# Patient Record
Sex: Female | Born: 1950
Health system: Southern US, Community
[De-identification: ages and names within clinical notes are randomized; demographics above are authoritative.]

## PROBLEM LIST (undated history)

## (undated) DIAGNOSIS — I10 Essential (primary) hypertension: Secondary | ICD-10-CM

## (undated) DIAGNOSIS — E871 Hypo-osmolality and hyponatremia: Secondary | ICD-10-CM

## (undated) DIAGNOSIS — F419 Anxiety disorder, unspecified: Secondary | ICD-10-CM

---

## 2000-02-06 ENCOUNTER — Encounter: Payer: Self-pay | Admitting: Internal Medicine

## 2000-02-06 ENCOUNTER — Encounter: Admission: RE | Admit: 2000-02-06 | Discharge: 2000-02-06 | Payer: Self-pay | Admitting: Internal Medicine

## 2000-02-13 ENCOUNTER — Other Ambulatory Visit: Admission: RE | Admit: 2000-02-13 | Discharge: 2000-02-13 | Payer: Self-pay | Admitting: Internal Medicine

## 2003-04-05 ENCOUNTER — Ambulatory Visit (HOSPITAL_COMMUNITY): Admission: RE | Admit: 2003-04-05 | Discharge: 2003-04-05 | Payer: Self-pay | Admitting: Gastroenterology

## 2003-04-05 ENCOUNTER — Encounter (INDEPENDENT_AMBULATORY_CARE_PROVIDER_SITE_OTHER): Payer: Self-pay | Admitting: Specialist

## 2004-06-16 ENCOUNTER — Other Ambulatory Visit: Admission: RE | Admit: 2004-06-16 | Discharge: 2004-06-16 | Payer: Self-pay | Admitting: Family Medicine

## 2005-06-18 ENCOUNTER — Other Ambulatory Visit: Admission: RE | Admit: 2005-06-18 | Discharge: 2005-06-18 | Payer: Self-pay | Admitting: Family Medicine

## 2006-07-08 ENCOUNTER — Other Ambulatory Visit: Admission: RE | Admit: 2006-07-08 | Discharge: 2006-07-08 | Payer: Self-pay | Admitting: Family Medicine

## 2007-09-22 ENCOUNTER — Other Ambulatory Visit: Admission: RE | Admit: 2007-09-22 | Discharge: 2007-09-22 | Payer: Self-pay | Admitting: Family Medicine

## 2009-09-28 ENCOUNTER — Other Ambulatory Visit: Admission: RE | Admit: 2009-09-28 | Discharge: 2009-09-28 | Payer: Self-pay | Admitting: Family Medicine

## 2010-10-04 ENCOUNTER — Other Ambulatory Visit: Payer: Self-pay | Admitting: Physician Assistant

## 2010-10-04 ENCOUNTER — Other Ambulatory Visit (HOSPITAL_COMMUNITY)
Admission: RE | Admit: 2010-10-04 | Discharge: 2010-10-04 | Disposition: A | Discharge status: Home / Self Care | Payer: BC Managed Care – PPO | Source: Ambulatory Visit | Attending: Family Medicine | Admitting: Family Medicine

## 2010-10-04 DIAGNOSIS — Z01419 Encounter for gynecological examination (general) (routine) without abnormal findings: Secondary | ICD-10-CM | POA: Insufficient documentation

## 2010-10-05 ENCOUNTER — Other Ambulatory Visit: Payer: Self-pay | Admitting: Dermatology

## 2010-12-26 ENCOUNTER — Other Ambulatory Visit: Payer: Self-pay | Admitting: Dermatology

## 2011-01-19 NOTE — Op Note (Signed)
   NAME:  Jenna [name], Jenna [name]                        ACCOUNT NO.:  [id number]   MEDICAL RECORD NO.:  [id number]                   PATIENT TYPE:  AMB   LOCATION:  ENDO                                 FACILITY:  [facility]   PHYSICIAN:  Graylin Shiver, M.D.                DATE OF BIRTH:  [birth year]   DATE OF PROCEDURE:  04/05/2003  DATE OF DISCHARGE:                                 OPERATIVE REPORT   PROCEDURE:  Colonoscopy with biopsy.   INDICATION FOR PROCEDURE:  Family history of colon polyps.   PREMEDICATION:  Fentanyl 62.5 mcg IV, Versed 7 mg IV.   Informed consent was obtained after explanation of the risks of bleeding,  infection, and perforation.   DESCRIPTION OF PROCEDURE:  With the patient in the left lateral decubitus  position, a rectal exam was performed and no masses were felt.  The Olympus  colonoscope was inserted into the rectum and advanced around the colon to  the cecum, the cecal landmarks identified.  The cecum and the ascending  colon were normal.  At the level of the hepatic flexure there was a small 3  mm sessile polyp removed with cold forceps.  The transverse colon looked  normal.  The descending colon, sigmoid, and rectum looked normal.  She  tolerated the procedure well without complications.   IMPRESSION:  Small polyp at the hepatic flexure.   PLAN:  The pathology will be checked.                                               Graylin Shiver, M.D.    Germain Osgood  D:  04/05/2003  T:  04/05/2003  Job:  [id number]   cc:   Ma Hillock, PA, Deboraha Sprang at [facility]

## 2011-10-18 ENCOUNTER — Other Ambulatory Visit: Payer: Self-pay | Admitting: Physician Assistant

## 2011-10-18 ENCOUNTER — Other Ambulatory Visit (HOSPITAL_COMMUNITY)
Admission: RE | Admit: 2011-10-18 | Discharge: 2011-10-18 | Disposition: A | Discharge status: Home / Self Care | Payer: BC Managed Care – PPO | Source: Ambulatory Visit | Attending: Family Medicine | Admitting: Family Medicine

## 2011-10-18 DIAGNOSIS — Z01419 Encounter for gynecological examination (general) (routine) without abnormal findings: Secondary | ICD-10-CM | POA: Insufficient documentation

## 2012-02-22 ENCOUNTER — Other Ambulatory Visit: Payer: Self-pay | Admitting: Dermatology

## 2012-10-24 ENCOUNTER — Other Ambulatory Visit (HOSPITAL_COMMUNITY)
Admission: RE | Admit: 2012-10-24 | Discharge: 2012-10-24 | Disposition: A | Discharge status: Home / Self Care | Payer: BC Managed Care – PPO | Source: Ambulatory Visit | Attending: Family Medicine | Admitting: Family Medicine

## 2012-10-24 ENCOUNTER — Other Ambulatory Visit: Payer: Self-pay | Admitting: Physician Assistant

## 2012-10-24 DIAGNOSIS — Z124 Encounter for screening for malignant neoplasm of cervix: Secondary | ICD-10-CM | POA: Insufficient documentation

## 2014-04-08 ENCOUNTER — Other Ambulatory Visit: Payer: Self-pay | Admitting: Dermatology

## 2014-10-05 ENCOUNTER — Other Ambulatory Visit: Payer: Self-pay | Admitting: Dermatology

## 2014-11-18 ENCOUNTER — Other Ambulatory Visit (HOSPITAL_COMMUNITY)
Admission: RE | Admit: 2014-11-18 | Discharge: 2014-11-18 | Disposition: A | Discharge status: Home / Self Care | Payer: 59 | Source: Ambulatory Visit | Attending: Family Medicine | Admitting: Family Medicine

## 2014-11-18 ENCOUNTER — Other Ambulatory Visit: Payer: Self-pay | Admitting: Physician Assistant

## 2014-11-18 DIAGNOSIS — Z124 Encounter for screening for malignant neoplasm of cervix: Secondary | ICD-10-CM | POA: Insufficient documentation

## 2014-11-19 LAB — CYTOLOGY - PAP

## 2015-11-25 ENCOUNTER — Ambulatory Visit
Admission: RE | Admit: 2015-11-25 | Discharge: 2015-11-25 | Disposition: A | Discharge status: Home / Self Care | Payer: BLUE CROSS/BLUE SHIELD | Source: Ambulatory Visit | Attending: Physician Assistant | Admitting: Physician Assistant

## 2015-11-25 ENCOUNTER — Other Ambulatory Visit: Payer: Self-pay | Admitting: Physician Assistant

## 2015-11-25 DIAGNOSIS — R52 Pain, unspecified: Secondary | ICD-10-CM

## 2016-03-23 ENCOUNTER — Encounter: Payer: Self-pay | Admitting: Podiatry

## 2016-03-23 ENCOUNTER — Ambulatory Visit (INDEPENDENT_AMBULATORY_CARE_PROVIDER_SITE_OTHER): Payer: BLUE CROSS/BLUE SHIELD | Admitting: Podiatry

## 2016-03-23 ENCOUNTER — Ambulatory Visit (INDEPENDENT_AMBULATORY_CARE_PROVIDER_SITE_OTHER): Payer: BLUE CROSS/BLUE SHIELD

## 2016-03-23 VITALS — BP 166/101 | HR 52 | Resp 14

## 2016-03-23 DIAGNOSIS — M79672 Pain in left foot: Secondary | ICD-10-CM | POA: Diagnosis not present

## 2016-03-23 DIAGNOSIS — M21619 Bunion of unspecified foot: Secondary | ICD-10-CM | POA: Diagnosis not present

## 2016-03-23 NOTE — Patient Instructions (Signed)
Bunionectomy A bunionectomy is a surgical procedure to remove a bunion. A bunion is a visible bump of bone on the inside of your foot where your big toe meets the rest of your foot. A bunion can develop when pressure turns this bone (first metatarsal) toward the other toes. Shoes that are too tight are the most common cause of bunions. Bunions can also be caused by diseases, such as arthritis and polio. You may need a bunionectomy if your bunion is very large and painful or it affects your ability to walk. LET [facility] CARE PROVIDER KNOW ABOUT:  Any allergies you have.  All medicines you are taking, including vitamins, herbs, eye drops, creams, and over-the-counter medicines.  Previous problems you or members of your family have had with the use of anesthetics.  Any blood disorders you have.  Previous surgeries you have had.  Medical conditions you have. RISKS AND COMPLICATIONS  Generally, this is a safe procedure. However, problems may occur, including:  Infection.  Pain.  Nerve damage.  Bleeding or blood clots.  Reactions to medicines.  Numbness, stiffness, or arthritis in your toe.  Foot problems that continue even after the procedure. BEFORE THE PROCEDURE  Ask your health care provider about:  Changing or stopping your regular medicines. This is especially important if you are taking diabetes medicines or blood thinners.  Taking medicines such as aspirin and ibuprofen. These medicines can thin your blood. Do not take these medicines before your procedure if your health care provider instructs you not to.  Do not drink alcohol before the procedure as directed by your health care provider.  Do not use tobacco products, including cigarettes, chewing tobacco, or electronic cigarettes, before the procedure as directed by your health care provider. If you need help quitting, ask your health care provider.  Ask your health care provider what kind of medicine you will be  given during your procedure. A bunionectomy may be done using one of these:  A medicine that numbs the area (local anesthetic).  A medicine that makes you go to sleep (general anesthetic). If you will be given general anesthetic, do not eat or drink anything after midnight on the night before the procedure or as directed by your health care provider. PROCEDURE  An IV tube may be inserted into a vein.  You will be given local anesthetic or general anesthetic.  The surgeon will make a cut (incision) over the enlarged area at the first joint of the big toe. The surgeon will remove the bunion.  You may have more than one incision if any of the bones in your big toe need to be moved. A bone itself may need to be cut.  Sometimes the tissues around the big toe may also need to be cut then tightened or loosened to reposition the toe.  Screws or other hardware may be used to keep your foot in thecorrect position.  The incision will be closed with stitches (sutures) and covered with adhesive strips or another type of bandage (dressing). AFTER THE PROCEDURE  You may spend some time in a recovery area.  Your blood pressure, heart rate, breathing rate, and blood oxygen level will be monitored often until the medicines you were given have worn off.   This information is not intended to replace advice given to you by your health care provider. Make sure you discuss any questions you have with your health care provider.   Document Released: 08/03/2005 Document Revised: 05/11/2015 Document Reviewed: 04/07/2014  Chartered certified accountant Patient Education [employer].

## 2016-03-23 NOTE — Progress Notes (Signed)
   Subjective:    Patient ID: Jenna [name], female    DOB: 1950/09/07, 65 y.o.   MRN: [id number]  HPI  My left great to started hurting more persistently in the last few months     Review of Systems  All other systems reviewed and are negative.      Objective:   Physical Exam        Assessment & Plan:

## 2016-03-28 NOTE — Progress Notes (Signed)
Subjective:     Patient ID: Jenna [name], female   DOB: [birth year], 65 y.o.   MRN: [id number]  HPI patient presents stating that she's had a painful bunion has gotten gradually worse over the last year on the left over right foot and makes it hard to wear shoe gear comfortably. She's had history of injury and history of this problem in the family   Review of Systems  All other systems reviewed and are negative.      Objective:   Physical Exam  Constitutional: She is oriented to person, place, and time.  Cardiovascular: Intact distal pulses.   Musculoskeletal: Normal range of motion.  Neurological: She is oriented to person, place, and time.  Skin: Skin is warm.  Nursing note and vitals reviewed.  neurovascular status intact muscle strength adequate range of motion within normal limits with patient found to have hyperostosis medial aspect first metatarsal left over right with redness and pain around the joint surface and deviation the hallux against the second toe. 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 I do think long-term that this would be best to go ahead and fix and I discussed surgery and reviewed with patient would be required. Patient wants surgery but needs to wait till September due to going on vacation the first week of September. She will reappoint for consult and we will get this fixed for  X-ray report was negative for signs of stress fracture or arthritis and did indicate elevation of the intermetatarsal angle between the first and second metatarsal bilateral

## 2016-05-11 ENCOUNTER — Ambulatory Visit (INDEPENDENT_AMBULATORY_CARE_PROVIDER_SITE_OTHER): Payer: BLUE CROSS/BLUE SHIELD | Admitting: Podiatry

## 2016-05-11 ENCOUNTER — Encounter: Payer: Self-pay | Admitting: Podiatry

## 2016-05-11 DIAGNOSIS — M21619 Bunion of unspecified foot: Secondary | ICD-10-CM | POA: Diagnosis not present

## 2016-05-11 DIAGNOSIS — M205X2 Other deformities of toe(s) (acquired), left foot: Secondary | ICD-10-CM

## 2016-05-11 NOTE — Patient Instructions (Signed)

## 2016-05-13 NOTE — Progress Notes (Signed)
Subjective:     Patient ID: Jenna [name], female   DOB: 03-06-1951, 65 y.o.   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 Patient wants surgery and after extensive review signs consent form for correction knowing all possible complications as listed and patient is dispensed air fracture walker with instructions and encouraged to call with any questions. Understands recovery can take 6 months to one year

## 2016-05-22 ENCOUNTER — Encounter: Payer: Self-pay | Admitting: Podiatry

## 2016-05-22 DIAGNOSIS — M2012 Hallux valgus (acquired), left foot: Secondary | ICD-10-CM | POA: Diagnosis not present

## 2016-05-25 NOTE — Progress Notes (Signed)
DOS 09.19.2017 Liane Comber Bunionectomy (cutting and moving bone) with Pin Fixation Left Foot

## 2016-05-31 ENCOUNTER — Encounter: Payer: Self-pay | Admitting: Podiatry

## 2016-05-31 ENCOUNTER — Ambulatory Visit (INDEPENDENT_AMBULATORY_CARE_PROVIDER_SITE_OTHER): Payer: BLUE CROSS/BLUE SHIELD

## 2016-05-31 ENCOUNTER — Ambulatory Visit (INDEPENDENT_AMBULATORY_CARE_PROVIDER_SITE_OTHER): Payer: BLUE CROSS/BLUE SHIELD | Admitting: Podiatry

## 2016-05-31 VITALS — BP 160/82

## 2016-05-31 DIAGNOSIS — M21619 Bunion of unspecified foot: Secondary | ICD-10-CM

## 2016-05-31 DIAGNOSIS — M205X2 Other deformities of toe(s) (acquired), left foot: Secondary | ICD-10-CM | POA: Diagnosis not present

## 2016-05-31 NOTE — Progress Notes (Signed)
Subjective:     Patient ID: Jenna [name], female   DOB: [birth year], 65 y.o.   MRN: [id number]  HPI patient states I'm doing well with minimal discomfort   Review of Systems     Objective:   Physical Exam Neurovascular status intact negative Homans sign noted with patient's first MPJ left doing well with good range of motion and no crepitus noted with wound edges well coapted    Assessment:     Doing well post osteotomy first metatarsal left with wound edges coapted well good alignment and good range of motion    Plan:     X-ray reviewed and applied above ankle Bio[employer] brace to plantarflexed the hallux to provide stretching type support. Patient had sterile dressing reapplied continue immobilization elevation and reappoint to recheck  X-ray report indicated osteotomies healing well pins in place with joint congruence

## 2016-06-21 ENCOUNTER — Ambulatory Visit (INDEPENDENT_AMBULATORY_CARE_PROVIDER_SITE_OTHER): Payer: BLUE CROSS/BLUE SHIELD | Admitting: Podiatry

## 2016-06-21 ENCOUNTER — Ambulatory Visit (INDEPENDENT_AMBULATORY_CARE_PROVIDER_SITE_OTHER): Payer: BLUE CROSS/BLUE SHIELD

## 2016-06-21 ENCOUNTER — Other Ambulatory Visit: Payer: BLUE CROSS/BLUE SHIELD

## 2016-06-21 DIAGNOSIS — M21619 Bunion of unspecified foot: Secondary | ICD-10-CM | POA: Diagnosis not present

## 2016-06-21 DIAGNOSIS — M205X2 Other deformities of toe(s) (acquired), left foot: Secondary | ICD-10-CM

## 2016-06-27 NOTE — Progress Notes (Signed)
Subjective:     Patient ID: Jenna [name], female   DOB: May 22, 1951, 65 y.o.   MRN: [id number]  HPI patient states she's doing well with her foot   Review of Systems     Objective:   Physical Exam Neurovascular status intact negative Homans sign noted with well healed surgical site first metatarsal with good alignment and good range of motion    Assessment:     Doing well post [place] osteotomy    Plan:     Reviewed x-rays and advised on continued anti-inflammatories immobilization and gradually increase in shoe gear. Patient will be seen back to recheck  X-ray report indicated that there is good alignment the pins are in place and the joint is congruence

## 2016-07-19 ENCOUNTER — Ambulatory Visit (INDEPENDENT_AMBULATORY_CARE_PROVIDER_SITE_OTHER): Payer: BLUE CROSS/BLUE SHIELD

## 2016-07-19 ENCOUNTER — Encounter: Payer: Self-pay | Admitting: Podiatry

## 2016-07-19 ENCOUNTER — Ambulatory Visit (INDEPENDENT_AMBULATORY_CARE_PROVIDER_SITE_OTHER): Payer: BLUE CROSS/BLUE SHIELD | Admitting: Podiatry

## 2016-07-19 DIAGNOSIS — M21619 Bunion of unspecified foot: Secondary | ICD-10-CM | POA: Diagnosis not present

## 2016-07-19 DIAGNOSIS — Z9889 Other specified postprocedural states: Secondary | ICD-10-CM | POA: Diagnosis not present

## 2016-07-19 DIAGNOSIS — M205X2 Other deformities of toe(s) (acquired), left foot: Secondary | ICD-10-CM | POA: Diagnosis not present

## 2016-07-20 NOTE — Progress Notes (Signed)
Subjective:     Patient ID: Jenna [name], female   DOB: 1951-05-20, 65 y.o.   MRN: [id number]  HPI patient presents stating I'm doing very well with my foot with minimal discomfort   Review of Systems     Objective:   Physical Exam Neurovascular status intact no health history changes with good alignment noted range of motion and wound edges that are well coapted with no opening drainage or redness    Assessment:     Doing well post osteotomy left    Plan:     Advised on return to normal activities continuation range of motion exercises and reappoint as needed if any issues should occur  X-ray report indicated pins are in place with no signs of movement or other pathology

## 2017-03-21 IMAGING — CR DG HIP (WITH OR WITHOUT PELVIS) 2-3V*R*
2 series · 2 of 2 positions shown · non-contrast
Comparison: None.

CLINICAL DATA: Right hip pain for 2 months, no known injury

EXAM:
DG HIP (WITH OR WITHOUT PELVIS) 2-3V RIGHT

[view not recorded (1 of 2)]
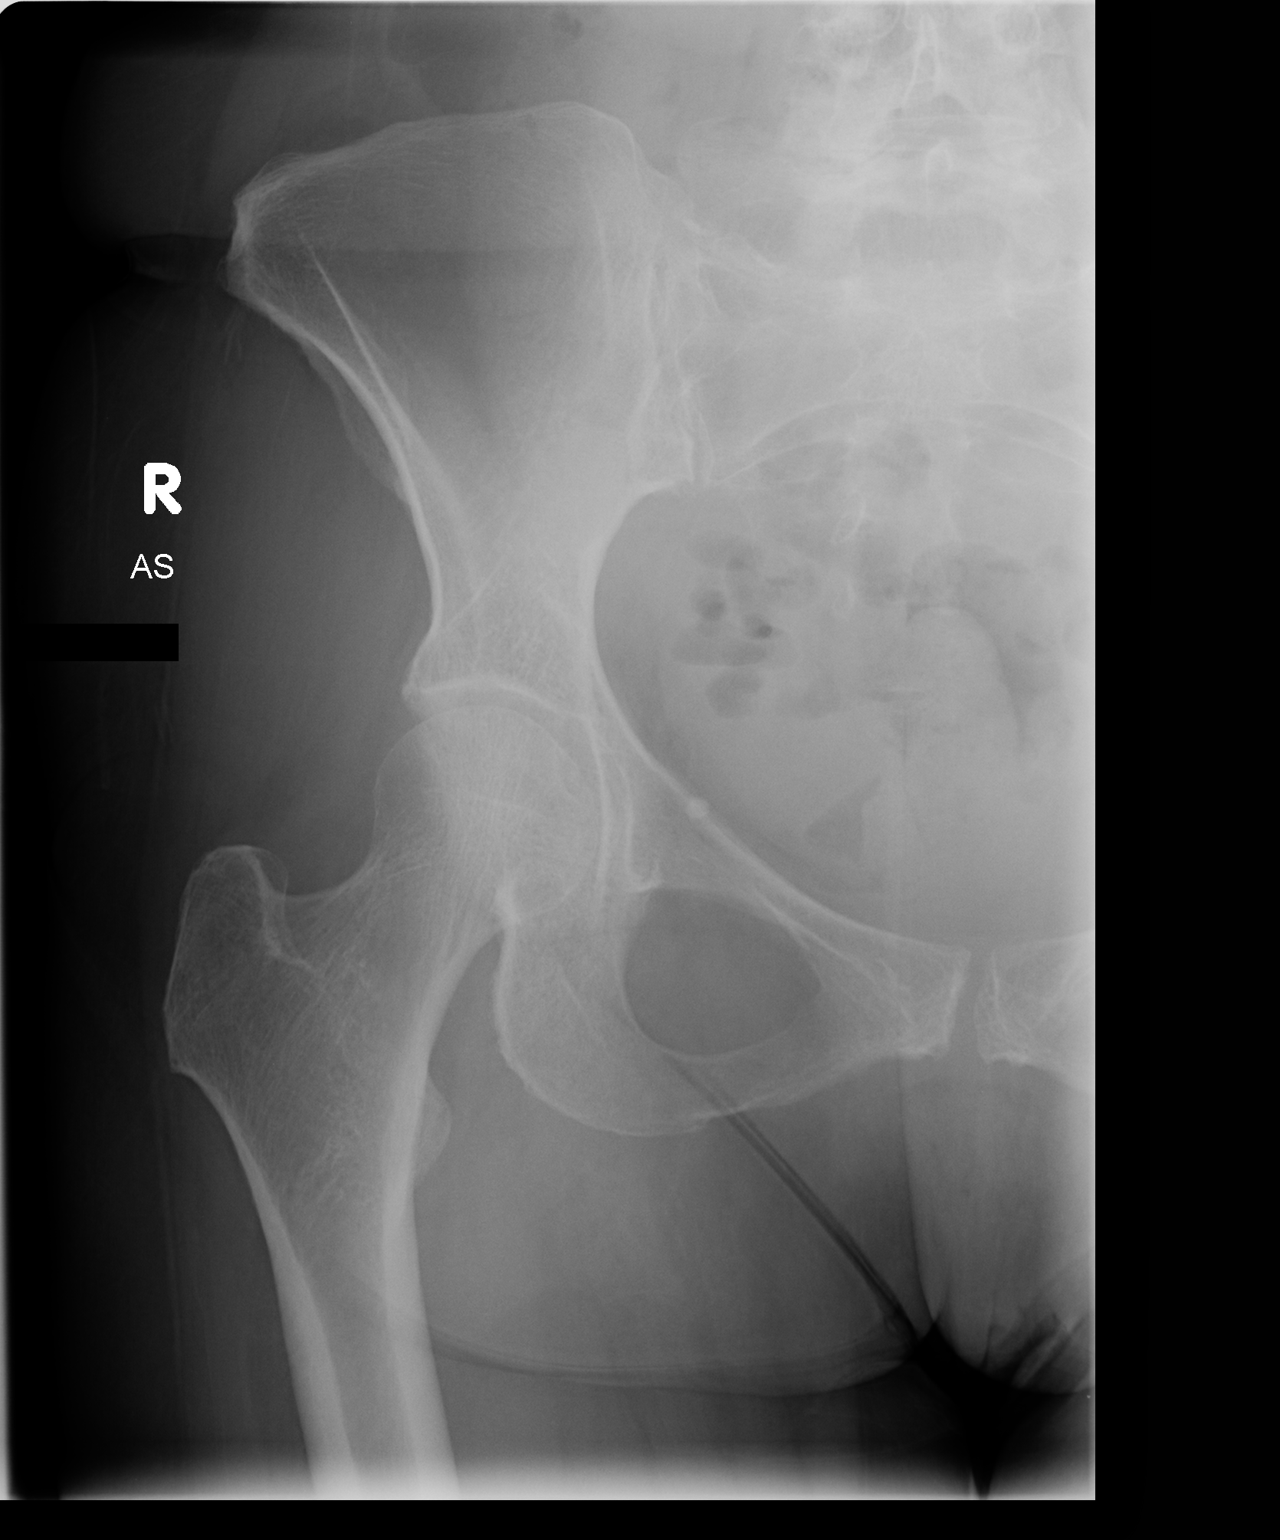

[view not recorded (2 of 2)]
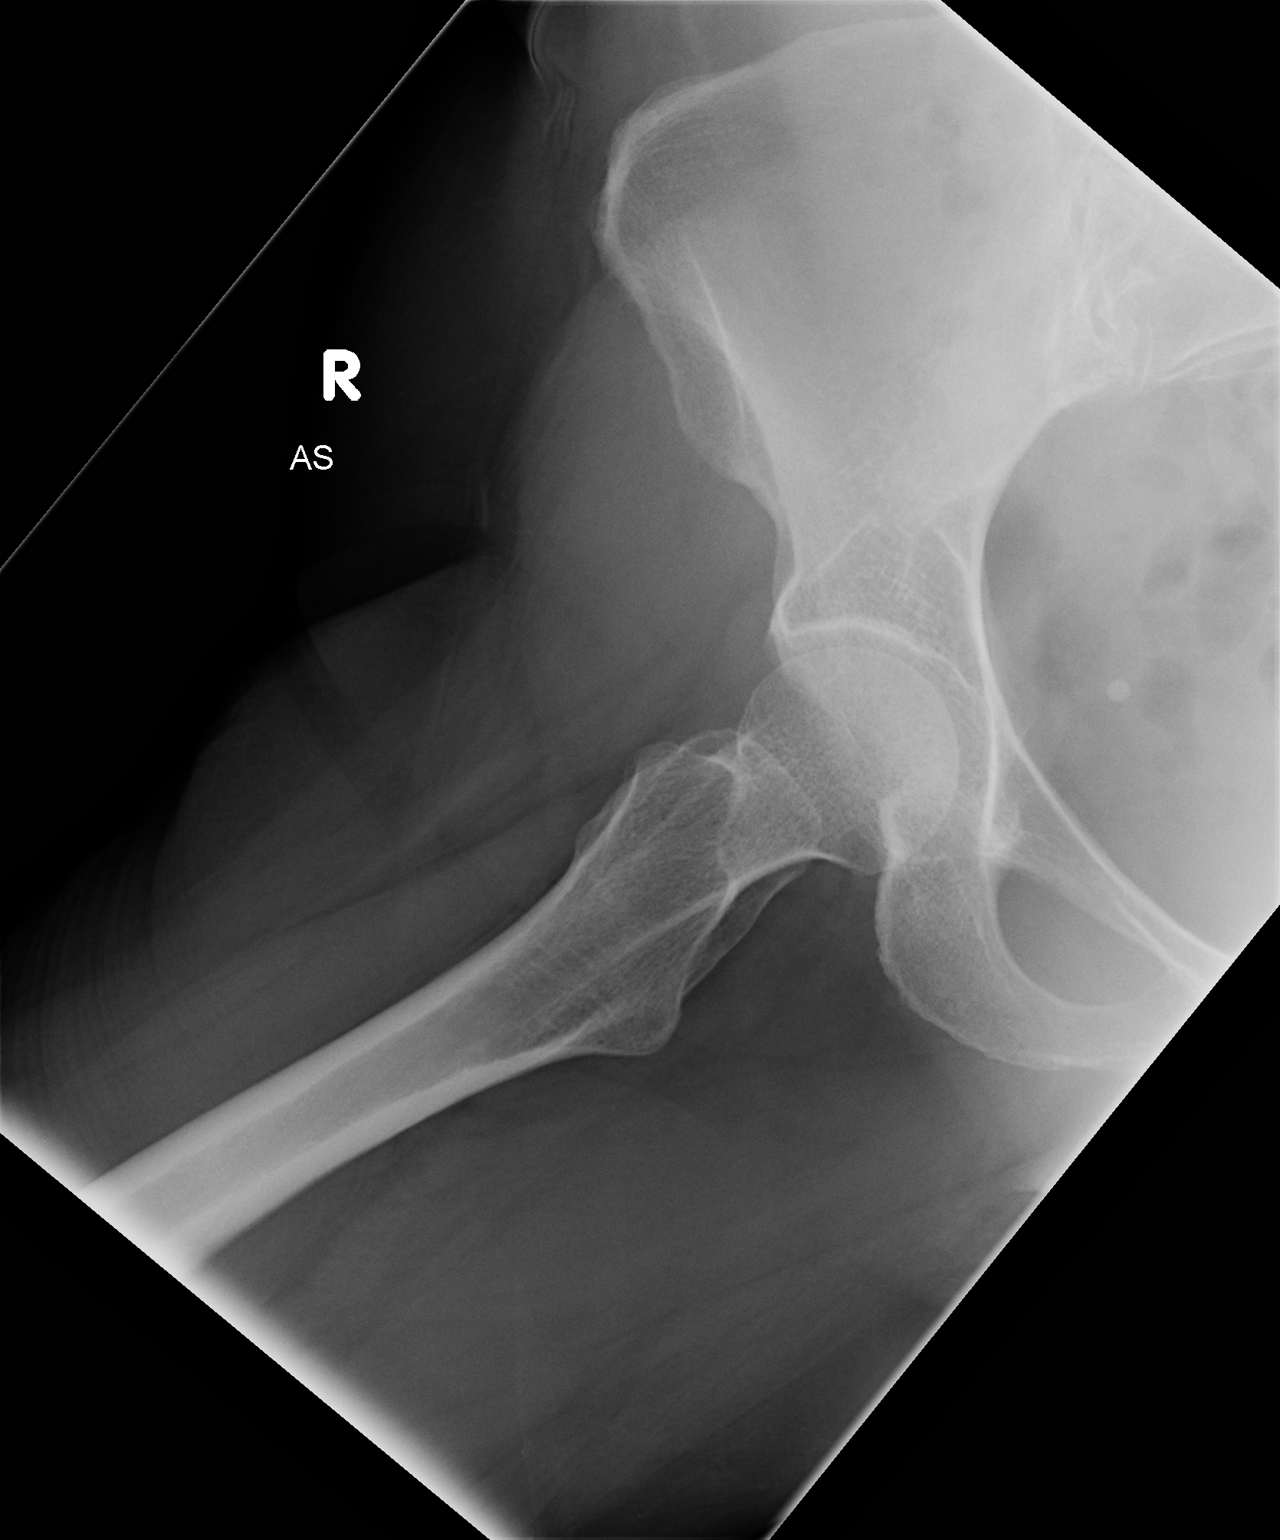

[2 of 2 positions shown; findings below may reference images not displayed]

FINDINGS: Two views of the right hip submitted. No acute fracture or
subluxation. No radiopaque foreign body. Right pelvic phlebolith is
noted. Joint space is preserved.
IMPRESSION: Negative.

## 2017-09-16 DIAGNOSIS — H2513 Age-related nuclear cataract, bilateral: Secondary | ICD-10-CM | POA: Diagnosis not present

## 2017-09-20 DIAGNOSIS — M9903 Segmental and somatic dysfunction of lumbar region: Secondary | ICD-10-CM | POA: Diagnosis not present

## 2017-09-20 DIAGNOSIS — M546 Pain in thoracic spine: Secondary | ICD-10-CM | POA: Diagnosis not present

## 2017-09-20 DIAGNOSIS — M5417 Radiculopathy, lumbosacral region: Secondary | ICD-10-CM | POA: Diagnosis not present

## 2017-09-20 DIAGNOSIS — M9902 Segmental and somatic dysfunction of thoracic region: Secondary | ICD-10-CM | POA: Diagnosis not present

## 2017-09-20 DIAGNOSIS — M62830 Muscle spasm of back: Secondary | ICD-10-CM | POA: Diagnosis not present

## 2017-09-26 DIAGNOSIS — M62830 Muscle spasm of back: Secondary | ICD-10-CM | POA: Diagnosis not present

## 2017-09-26 DIAGNOSIS — M9903 Segmental and somatic dysfunction of lumbar region: Secondary | ICD-10-CM | POA: Diagnosis not present

## 2017-09-26 DIAGNOSIS — M546 Pain in thoracic spine: Secondary | ICD-10-CM | POA: Diagnosis not present

## 2017-09-26 DIAGNOSIS — M5417 Radiculopathy, lumbosacral region: Secondary | ICD-10-CM | POA: Diagnosis not present

## 2017-09-26 DIAGNOSIS — M9902 Segmental and somatic dysfunction of thoracic region: Secondary | ICD-10-CM | POA: Diagnosis not present

## 2017-10-02 DIAGNOSIS — M62830 Muscle spasm of back: Secondary | ICD-10-CM | POA: Diagnosis not present

## 2017-10-02 DIAGNOSIS — M5417 Radiculopathy, lumbosacral region: Secondary | ICD-10-CM | POA: Diagnosis not present

## 2017-10-02 DIAGNOSIS — M9902 Segmental and somatic dysfunction of thoracic region: Secondary | ICD-10-CM | POA: Diagnosis not present

## 2017-10-02 DIAGNOSIS — M546 Pain in thoracic spine: Secondary | ICD-10-CM | POA: Diagnosis not present

## 2017-10-02 DIAGNOSIS — M9903 Segmental and somatic dysfunction of lumbar region: Secondary | ICD-10-CM | POA: Diagnosis not present

## 2017-10-04 DIAGNOSIS — M5417 Radiculopathy, lumbosacral region: Secondary | ICD-10-CM | POA: Diagnosis not present

## 2017-10-04 DIAGNOSIS — M546 Pain in thoracic spine: Secondary | ICD-10-CM | POA: Diagnosis not present

## 2017-10-04 DIAGNOSIS — M62830 Muscle spasm of back: Secondary | ICD-10-CM | POA: Diagnosis not present

## 2017-10-04 DIAGNOSIS — M9903 Segmental and somatic dysfunction of lumbar region: Secondary | ICD-10-CM | POA: Diagnosis not present

## 2017-10-04 DIAGNOSIS — M9902 Segmental and somatic dysfunction of thoracic region: Secondary | ICD-10-CM | POA: Diagnosis not present

## 2017-10-09 DIAGNOSIS — M9903 Segmental and somatic dysfunction of lumbar region: Secondary | ICD-10-CM | POA: Diagnosis not present

## 2017-10-09 DIAGNOSIS — M546 Pain in thoracic spine: Secondary | ICD-10-CM | POA: Diagnosis not present

## 2017-10-09 DIAGNOSIS — M9902 Segmental and somatic dysfunction of thoracic region: Secondary | ICD-10-CM | POA: Diagnosis not present

## 2017-10-09 DIAGNOSIS — M5417 Radiculopathy, lumbosacral region: Secondary | ICD-10-CM | POA: Diagnosis not present

## 2017-10-09 DIAGNOSIS — M62830 Muscle spasm of back: Secondary | ICD-10-CM | POA: Diagnosis not present

## 2017-10-11 DIAGNOSIS — M62830 Muscle spasm of back: Secondary | ICD-10-CM | POA: Diagnosis not present

## 2017-10-11 DIAGNOSIS — M9903 Segmental and somatic dysfunction of lumbar region: Secondary | ICD-10-CM | POA: Diagnosis not present

## 2017-10-11 DIAGNOSIS — M5417 Radiculopathy, lumbosacral region: Secondary | ICD-10-CM | POA: Diagnosis not present

## 2017-10-11 DIAGNOSIS — M546 Pain in thoracic spine: Secondary | ICD-10-CM | POA: Diagnosis not present

## 2017-10-11 DIAGNOSIS — M9902 Segmental and somatic dysfunction of thoracic region: Secondary | ICD-10-CM | POA: Diagnosis not present

## 2017-10-18 DIAGNOSIS — E7800 Pure hypercholesterolemia, unspecified: Secondary | ICD-10-CM | POA: Diagnosis not present

## 2017-10-24 DIAGNOSIS — M62830 Muscle spasm of back: Secondary | ICD-10-CM | POA: Diagnosis not present

## 2017-10-24 DIAGNOSIS — M9902 Segmental and somatic dysfunction of thoracic region: Secondary | ICD-10-CM | POA: Diagnosis not present

## 2017-10-24 DIAGNOSIS — M9903 Segmental and somatic dysfunction of lumbar region: Secondary | ICD-10-CM | POA: Diagnosis not present

## 2017-10-24 DIAGNOSIS — M5417 Radiculopathy, lumbosacral region: Secondary | ICD-10-CM | POA: Diagnosis not present

## 2017-10-24 DIAGNOSIS — M546 Pain in thoracic spine: Secondary | ICD-10-CM | POA: Diagnosis not present

## 2017-11-01 DIAGNOSIS — L821 Other seborrheic keratosis: Secondary | ICD-10-CM | POA: Diagnosis not present

## 2017-11-01 DIAGNOSIS — Z808 Family history of malignant neoplasm of other organs or systems: Secondary | ICD-10-CM | POA: Diagnosis not present

## 2017-11-01 DIAGNOSIS — Z23 Encounter for immunization: Secondary | ICD-10-CM | POA: Diagnosis not present

## 2017-11-01 DIAGNOSIS — L570 Actinic keratosis: Secondary | ICD-10-CM | POA: Diagnosis not present

## 2017-11-01 DIAGNOSIS — Z85828 Personal history of other malignant neoplasm of skin: Secondary | ICD-10-CM | POA: Diagnosis not present

## 2017-11-01 DIAGNOSIS — L814 Other melanin hyperpigmentation: Secondary | ICD-10-CM | POA: Diagnosis not present

## 2017-11-07 DIAGNOSIS — R69 Illness, unspecified: Secondary | ICD-10-CM | POA: Diagnosis not present

## 2017-11-08 DIAGNOSIS — M9903 Segmental and somatic dysfunction of lumbar region: Secondary | ICD-10-CM | POA: Diagnosis not present

## 2017-11-08 DIAGNOSIS — M9902 Segmental and somatic dysfunction of thoracic region: Secondary | ICD-10-CM | POA: Diagnosis not present

## 2017-11-08 DIAGNOSIS — M546 Pain in thoracic spine: Secondary | ICD-10-CM | POA: Diagnosis not present

## 2017-11-08 DIAGNOSIS — M62830 Muscle spasm of back: Secondary | ICD-10-CM | POA: Diagnosis not present

## 2017-11-08 DIAGNOSIS — M5417 Radiculopathy, lumbosacral region: Secondary | ICD-10-CM | POA: Diagnosis not present

## 2017-11-22 DIAGNOSIS — M5417 Radiculopathy, lumbosacral region: Secondary | ICD-10-CM | POA: Diagnosis not present

## 2017-11-22 DIAGNOSIS — M9903 Segmental and somatic dysfunction of lumbar region: Secondary | ICD-10-CM | POA: Diagnosis not present

## 2017-11-22 DIAGNOSIS — M62830 Muscle spasm of back: Secondary | ICD-10-CM | POA: Diagnosis not present

## 2017-11-22 DIAGNOSIS — M9902 Segmental and somatic dysfunction of thoracic region: Secondary | ICD-10-CM | POA: Diagnosis not present

## 2017-11-22 DIAGNOSIS — M546 Pain in thoracic spine: Secondary | ICD-10-CM | POA: Diagnosis not present

## 2017-12-25 DIAGNOSIS — M62830 Muscle spasm of back: Secondary | ICD-10-CM | POA: Diagnosis not present

## 2017-12-25 DIAGNOSIS — M5417 Radiculopathy, lumbosacral region: Secondary | ICD-10-CM | POA: Diagnosis not present

## 2017-12-25 DIAGNOSIS — M9902 Segmental and somatic dysfunction of thoracic region: Secondary | ICD-10-CM | POA: Diagnosis not present

## 2017-12-25 DIAGNOSIS — M9903 Segmental and somatic dysfunction of lumbar region: Secondary | ICD-10-CM | POA: Diagnosis not present

## 2017-12-25 DIAGNOSIS — M546 Pain in thoracic spine: Secondary | ICD-10-CM | POA: Diagnosis not present

## 2018-01-02 ENCOUNTER — Other Ambulatory Visit (HOSPITAL_COMMUNITY)
Admission: RE | Admit: 2018-01-02 | Discharge: 2018-01-02 | Disposition: A | Discharge status: Home / Self Care | Payer: Medicare HMO | Source: Ambulatory Visit | Attending: Family Medicine | Admitting: Family Medicine

## 2018-01-02 ENCOUNTER — Other Ambulatory Visit: Payer: Self-pay | Admitting: Physician Assistant

## 2018-01-02 DIAGNOSIS — E7800 Pure hypercholesterolemia, unspecified: Secondary | ICD-10-CM | POA: Diagnosis not present

## 2018-01-02 DIAGNOSIS — Z0000 Encounter for general adult medical examination without abnormal findings: Secondary | ICD-10-CM | POA: Diagnosis not present

## 2018-01-02 DIAGNOSIS — Z124 Encounter for screening for malignant neoplasm of cervix: Secondary | ICD-10-CM | POA: Diagnosis not present

## 2018-01-02 DIAGNOSIS — Z23 Encounter for immunization: Secondary | ICD-10-CM | POA: Diagnosis not present

## 2018-01-02 DIAGNOSIS — M810 Age-related osteoporosis without current pathological fracture: Secondary | ICD-10-CM | POA: Diagnosis not present

## 2018-01-02 DIAGNOSIS — Z01419 Encounter for gynecological examination (general) (routine) without abnormal findings: Secondary | ICD-10-CM | POA: Insufficient documentation

## 2018-01-02 DIAGNOSIS — I10 Essential (primary) hypertension: Secondary | ICD-10-CM | POA: Diagnosis not present

## 2018-01-02 DIAGNOSIS — Z1159 Encounter for screening for other viral diseases: Secondary | ICD-10-CM | POA: Diagnosis not present

## 2018-01-02 DIAGNOSIS — R69 Illness, unspecified: Secondary | ICD-10-CM | POA: Diagnosis not present

## 2018-01-03 LAB — CYTOLOGY - PAP: Diagnosis: NEGATIVE

## 2018-02-11 DIAGNOSIS — R69 Illness, unspecified: Secondary | ICD-10-CM | POA: Diagnosis not present

## 2018-02-14 DIAGNOSIS — Z1231 Encounter for screening mammogram for malignant neoplasm of breast: Secondary | ICD-10-CM | POA: Diagnosis not present

## 2018-02-17 DIAGNOSIS — R69 Illness, unspecified: Secondary | ICD-10-CM | POA: Diagnosis not present

## 2018-03-04 DIAGNOSIS — M810 Age-related osteoporosis without current pathological fracture: Secondary | ICD-10-CM | POA: Diagnosis not present

## 2018-03-28 DIAGNOSIS — M810 Age-related osteoporosis without current pathological fracture: Secondary | ICD-10-CM | POA: Diagnosis not present

## 2018-04-23 DIAGNOSIS — L820 Inflamed seborrheic keratosis: Secondary | ICD-10-CM | POA: Diagnosis not present

## 2018-04-23 DIAGNOSIS — D485 Neoplasm of uncertain behavior of skin: Secondary | ICD-10-CM | POA: Diagnosis not present

## 2018-04-23 DIAGNOSIS — D045 Carcinoma in situ of skin of trunk: Secondary | ICD-10-CM | POA: Diagnosis not present

## 2018-05-08 DIAGNOSIS — R69 Illness, unspecified: Secondary | ICD-10-CM | POA: Diagnosis not present

## 2018-05-20 DIAGNOSIS — D045 Carcinoma in situ of skin of trunk: Secondary | ICD-10-CM | POA: Diagnosis not present

## 2018-05-20 DIAGNOSIS — C44529 Squamous cell carcinoma of skin of other part of trunk: Secondary | ICD-10-CM | POA: Diagnosis not present

## 2018-07-11 DIAGNOSIS — I10 Essential (primary) hypertension: Secondary | ICD-10-CM | POA: Diagnosis not present

## 2018-07-11 DIAGNOSIS — E7800 Pure hypercholesterolemia, unspecified: Secondary | ICD-10-CM | POA: Diagnosis not present

## 2018-07-18 DIAGNOSIS — H524 Presbyopia: Secondary | ICD-10-CM | POA: Diagnosis not present

## 2018-07-18 DIAGNOSIS — H52223 Regular astigmatism, bilateral: Secondary | ICD-10-CM | POA: Diagnosis not present

## 2018-09-29 DIAGNOSIS — M810 Age-related osteoporosis without current pathological fracture: Secondary | ICD-10-CM | POA: Diagnosis not present

## 2018-11-12 DIAGNOSIS — R69 Illness, unspecified: Secondary | ICD-10-CM | POA: Diagnosis not present

## 2019-01-13 DIAGNOSIS — Z808 Family history of malignant neoplasm of other organs or systems: Secondary | ICD-10-CM | POA: Diagnosis not present

## 2019-01-13 DIAGNOSIS — Z85828 Personal history of other malignant neoplasm of skin: Secondary | ICD-10-CM | POA: Diagnosis not present

## 2019-01-13 DIAGNOSIS — L814 Other melanin hyperpigmentation: Secondary | ICD-10-CM | POA: Diagnosis not present

## 2019-01-13 DIAGNOSIS — L821 Other seborrheic keratosis: Secondary | ICD-10-CM | POA: Diagnosis not present

## 2019-01-13 DIAGNOSIS — L910 Hypertrophic scar: Secondary | ICD-10-CM | POA: Diagnosis not present

## 2019-01-19 DIAGNOSIS — Z7189 Other specified counseling: Secondary | ICD-10-CM | POA: Diagnosis not present

## 2019-01-19 DIAGNOSIS — F410 Panic disorder [episodic paroxysmal anxiety] without agoraphobia: Secondary | ICD-10-CM | POA: Diagnosis not present

## 2019-01-19 DIAGNOSIS — R69 Illness, unspecified: Secondary | ICD-10-CM | POA: Diagnosis not present

## 2019-02-27 DIAGNOSIS — Z1231 Encounter for screening mammogram for malignant neoplasm of breast: Secondary | ICD-10-CM | POA: Diagnosis not present

## 2019-03-10 DIAGNOSIS — Z86010 Personal history of colonic polyps: Secondary | ICD-10-CM | POA: Diagnosis not present

## 2019-03-10 DIAGNOSIS — K625 Hemorrhage of anus and rectum: Secondary | ICD-10-CM | POA: Diagnosis not present

## 2019-03-31 DIAGNOSIS — M810 Age-related osteoporosis without current pathological fracture: Secondary | ICD-10-CM | POA: Diagnosis not present

## 2019-03-31 DIAGNOSIS — Z0000 Encounter for general adult medical examination without abnormal findings: Secondary | ICD-10-CM | POA: Diagnosis not present

## 2019-03-31 DIAGNOSIS — I10 Essential (primary) hypertension: Secondary | ICD-10-CM | POA: Diagnosis not present

## 2019-03-31 DIAGNOSIS — E7800 Pure hypercholesterolemia, unspecified: Secondary | ICD-10-CM | POA: Diagnosis not present

## 2019-03-31 DIAGNOSIS — Z23 Encounter for immunization: Secondary | ICD-10-CM | POA: Diagnosis not present

## 2019-03-31 DIAGNOSIS — R69 Illness, unspecified: Secondary | ICD-10-CM | POA: Diagnosis not present

## 2019-04-20 DIAGNOSIS — I10 Essential (primary) hypertension: Secondary | ICD-10-CM | POA: Diagnosis not present

## 2019-04-21 ENCOUNTER — Other Ambulatory Visit: Payer: Self-pay

## 2019-04-21 ENCOUNTER — Encounter (HOSPITAL_BASED_OUTPATIENT_CLINIC_OR_DEPARTMENT_OTHER): Payer: Self-pay | Admitting: *Deleted

## 2019-04-21 ENCOUNTER — Observation Stay (HOSPITAL_BASED_OUTPATIENT_CLINIC_OR_DEPARTMENT_OTHER)
Admission: EM | Admit: 2019-04-21 | Discharge: 2019-04-23 | Disposition: A | Discharge status: Home / Self Care | Payer: Medicare HMO | Attending: Family Medicine | Admitting: Family Medicine

## 2019-04-21 DIAGNOSIS — D72829 Elevated white blood cell count, unspecified: Secondary | ICD-10-CM | POA: Diagnosis not present

## 2019-04-21 DIAGNOSIS — E785 Hyperlipidemia, unspecified: Secondary | ICD-10-CM | POA: Insufficient documentation

## 2019-04-21 DIAGNOSIS — Z79899 Other long term (current) drug therapy: Secondary | ICD-10-CM | POA: Diagnosis not present

## 2019-04-21 DIAGNOSIS — R002 Palpitations: Secondary | ICD-10-CM | POA: Insufficient documentation

## 2019-04-21 DIAGNOSIS — E871 Hypo-osmolality and hyponatremia: Secondary | ICD-10-CM | POA: Diagnosis not present

## 2019-04-21 DIAGNOSIS — Z03818 Encounter for observation for suspected exposure to other biological agents ruled out: Secondary | ICD-10-CM | POA: Diagnosis not present

## 2019-04-21 DIAGNOSIS — F419 Anxiety disorder, unspecified: Secondary | ICD-10-CM | POA: Diagnosis not present

## 2019-04-21 DIAGNOSIS — I10 Essential (primary) hypertension: Secondary | ICD-10-CM | POA: Diagnosis not present

## 2019-04-21 DIAGNOSIS — R69 Illness, unspecified: Secondary | ICD-10-CM | POA: Diagnosis not present

## 2019-04-21 DIAGNOSIS — Z1159 Encounter for screening for other viral diseases: Secondary | ICD-10-CM | POA: Insufficient documentation

## 2019-04-21 DIAGNOSIS — E876 Hypokalemia: Principal | ICD-10-CM | POA: Insufficient documentation

## 2019-04-21 HISTORY — DX: Essential (primary) hypertension: I10

## 2019-04-21 HISTORY — DX: Anxiety disorder, unspecified: F41.9

## 2019-04-21 LAB — CBC WITH DIFFERENTIAL/PLATELET
Abs Immature Granulocytes: 0.08 10*3/uL — ABNORMAL HIGH (ref 0.00–0.07)
Basophils Absolute: 0 10*3/uL (ref 0.0–0.1)
Basophils Relative: 0 %
Eosinophils Absolute: 0.1 10*3/uL (ref 0.0–0.5)
Eosinophils Relative: 1 %
HCT: 42.4 % (ref 36.0–46.0)
Hemoglobin: 15 g/dL (ref 12.0–15.0)
Immature Granulocytes: 1 %
Lymphocytes Relative: 21 %
Lymphs Abs: 2.8 10*3/uL (ref 0.7–4.0)
MCH: 30.6 pg (ref 26.0–34.0)
MCHC: 35.4 g/dL (ref 30.0–36.0)
MCV: 86.5 fL (ref 80.0–100.0)
Monocytes Absolute: 1.1 10*3/uL — ABNORMAL HIGH (ref 0.1–1.0)
Monocytes Relative: 8 %
Neutro Abs: 9 10*3/uL — ABNORMAL HIGH (ref 1.7–7.7)
Neutrophils Relative %: 69 %
Platelets: 372 10*3/uL (ref 150–400)
RBC: 4.9 MIL/uL (ref 3.87–5.11)
RDW: 12 % (ref 11.5–15.5)
WBC: 13 10*3/uL — ABNORMAL HIGH (ref 4.0–10.5)
nRBC: 0 % (ref 0.0–0.2)

## 2019-04-21 LAB — BASIC METABOLIC PANEL
Anion gap: 13 (ref 5–15)
BUN: 10 mg/dL (ref 8–23)
CO2: 20 mmol/L — ABNORMAL LOW (ref 22–32)
Calcium: 9.2 mg/dL (ref 8.9–10.3)
Chloride: 90 mmol/L — ABNORMAL LOW (ref 98–111)
Creatinine, Ser: 0.55 mg/dL (ref 0.44–1.00)
GFR calc Af Amer: >60 mL/min (ref >60)
GFR calc non Af Amer: >60 mL/min (ref >60)
Glucose, Bld: 134 mg/dL — ABNORMAL HIGH (ref 70–99)
Potassium: 2.8 mmol/L — ABNORMAL LOW (ref 3.5–5.1)
Sodium: 123 mmol/L — ABNORMAL LOW (ref 135–145)

## 2019-04-21 LAB — URINALYSIS, MICROSCOPIC (REFLEX)

## 2019-04-21 LAB — URINALYSIS, ROUTINE W REFLEX MICROSCOPIC
Bilirubin Urine: NEGATIVE
Glucose, UA: NEGATIVE mg/dL
Hgb urine dipstick: NEGATIVE
Ketones, ur: NEGATIVE mg/dL
Nitrite: NEGATIVE
Protein, ur: NEGATIVE mg/dL
Specific Gravity, Urine: <1.005 — ABNORMAL LOW (ref 1.005–1.030)
pH: 7.5 (ref 5.0–8.0)

## 2019-04-21 LAB — TROPONIN I (HIGH SENSITIVITY): Troponin I (High Sensitivity): 4 ng/L (ref <18)

## 2019-04-21 MED ORDER — SODIUM CHLORIDE 0.9 % IV SOLN
Freq: Once | INTRAVENOUS | Status: AC
  Administered 2019-04-21: via INTRAVENOUS

## 2019-04-21 MED ORDER — POTASSIUM CHLORIDE CRYS ER 20 MEQ PO TBCR
40.0000 meq | EXTENDED_RELEASE_TABLET | Freq: Once | ORAL | Status: AC
  Administered 2019-04-21: 40 meq via ORAL
  Filled 2019-04-21: qty 2

## 2019-04-21 MED ORDER — POTASSIUM CHLORIDE 20 MEQ/15ML (10%) PO SOLN
20.0000 meq | Freq: Once | ORAL | Status: AC
  Administered 2019-04-21: 20 meq via ORAL
  Filled 2019-04-21: qty 15

## 2019-04-21 NOTE — ED Triage Notes (Signed)
Pt reports increase in her celexa (from 10mg  to 20mg ) 3 weeks ago. Pt reports that since that time she has felt lighteheaded, irregular HR,bilateral hand sweating and facial flush. Reports difficulty with sleep as well.

## 2019-04-21 NOTE — ED Notes (Signed)
ED Provider at bedside. 

## 2019-04-21 NOTE — ED Notes (Signed)
Pt reports issues with anxiety recently and not being able to sleep x 2 weeks. Tonight pt felt hot and flushed- tingling to hands and has been feeling dizzy as if she is off balance.

## 2019-04-21 NOTE — ED Provider Notes (Signed)
[place] [facility] Provider Note: [name], MD, FACEP  CSN: [id number] MRN: [id number] ARRIVAL: 04/21/19 at 2211 ROOM: 1437/1437-01   CHIEF COMPLAINT  Palpitations   HISTORY OF PRESENT ILLNESS  04/21/19 11:01 PM Jenna [name] is a 68 y.o. female who has had anxiety, nausea, decreased appetite and difficulty sleeping for several months.  She was placed on Celexa 10 mg daily in May.  This dose was increased to 20 mg July 25 as her symptoms have persisted.  Since starting the Celexa she has had episodes of sweaty hands, feeling off balance and feeling flushed.  She had an episode about 10 PM tonight that was worse than previous episodes.  She felt lightheaded, facially flushed, sweaty in her hands and felt like her heart was racing.  She denies her heartbeat feeling irregular.  There was no associated nausea, vomiting, chest pain or shortness of breath.    Past Medical History:  Diagnosis Date  . Anxiety   . Hypertension     No past surgical history on file.  Family History  Problem Relation Age of Onset  . Hypertension Mother     Social History   Tobacco Use  . Smoking status: Never Smoker  . Smokeless tobacco: Never Used  Substance Use Topics  . Alcohol use: Not Currently    Alcohol/week: 0.0 standard drinks  . Drug use: Not Currently    Prior to Admission medications   Medication Sig Start Date End Date Taking? Authorizing Provider  amLODipine (NORVASC) 5 MG tablet Take 5 mg by mouth daily.   Yes [provider]  atenolol (TENORMIN) 50 MG tablet Take 100 mg by mouth daily.  02/14/16  Yes [provider]  atorvastatin (LIPITOR) 20 MG tablet Take 20 mg by mouth daily.   Yes [provider]  calcium-vitamin D (OSCAL WITH D) 500-200 MG-UNIT tablet Take 1 tablet by mouth daily.   Yes [provider]  citalopram (CELEXA) 10 MG tablet Take 10 mg by mouth daily.  02/23/16  Yes [provider]   lisinopril-hydrochlorothiazide (PRINZIDE,ZESTORETIC) 20-25 MG tablet Take 1 tablet by mouth daily.  01/23/16  Yes [provider]    Allergies Patient has no known allergies.   REVIEW OF SYSTEMS  Negative except as noted here or in the History of Present Illness.   PHYSICAL EXAMINATION  Initial Vital Signs Blood pressure (!) 201/109, pulse 80, temperature 98.9 F (37.2 C), temperature source Oral, resp. rate 18, height 4\' 11"  (1.499 m), weight 42.6 kg, SpO2 100 %.  Examination General: Well-developed, well-nourished female in no acute distress; appearance consistent with age of record HENT: normocephalic; atraumatic Eyes: pupils equal, round and reactive to light; extraocular muscles intact Neck: supple Heart: regular rate and rhythm Lungs: clear to auscultation bilaterally Abdomen: soft; nondistended; nontender; bowel sounds present Extremities: No deformity; full range of motion; pulses normal Neurologic: Awake, alert and oriented; motor function intact in all extremities and symmetric; no facial droop Skin: Warm and dry Psychiatric: Normal mood and affect   RESULTS  Summary of this visit's results, reviewed by myself:   EKG Interpretation  Date/Time:  Tuesday April 21 2019 22:21:27 EDT Ventricular Rate:  82 PR Interval:    QRS Duration: 93 QT Interval:  397 QTC Calculation: 464 R Axis:   39 Text Interpretation:  Sinus rhythm Consider left ventricular hypertrophy Baseline wander in lead(s) V1 No previous ECGs available Confirmed by Shanon Rosser [phone]) on 04/21/2019 10:35:25 PM      Laboratory  Studies: Results for orders placed or performed during the hospital encounter of 04/21/19 (from the past 24 hour(s))  Basic metabolic panel     Status: Abnormal   Collection Time: 04/21/19 10:23 PM  Result Value Ref Range   Sodium 123 (L) 135 - 145 mmol/L   Potassium 2.8 (L) 3.5 - 5.1 mmol/L   Chloride 90 (L) 98 - 111 mmol/L   CO2 20 (L) 22 - 32 mmol/L    Glucose, Bld 134 (H) 70 - 99 mg/dL   BUN 10 8 - 23 mg/dL   Creatinine, Ser 0.55 0.44 - 1.00 mg/dL   Calcium 9.2 8.9 - 10.3 mg/dL   GFR calc non Af Amer >60 >60 mL/min   GFR calc Af Amer >60 >60 mL/min   Anion gap 13 5 - 15  CBC with Differential     Status: Abnormal   Collection Time: 04/21/19 10:23 PM  Result Value Ref Range   WBC 13.0 (H) 4.0 - 10.5 K/uL   RBC 4.90 3.87 - 5.11 MIL/uL   Hemoglobin 15.0 12.0 - 15.0 g/dL   HCT 42.4 36.0 - 46.0 %   MCV 86.5 80.0 - 100.0 fL   MCH 30.6 26.0 - 34.0 pg   MCHC 35.4 30.0 - 36.0 g/dL   RDW 12.0 11.5 - 15.5 %   Platelets 372 150 - 400 K/uL   nRBC 0.0 0.0 - 0.2 %   Neutrophils Relative % 69 %   Neutro Abs 9.0 (H) 1.7 - 7.7 K/uL   Lymphocytes Relative 21 %   Lymphs Abs 2.8 0.7 - 4.0 K/uL   Monocytes Relative 8 %   Monocytes Absolute 1.1 (H) 0.1 - 1.0 K/uL   Eosinophils Relative 1 %   Eosinophils Absolute 0.1 0.0 - 0.5 K/uL   Basophils Relative 0 %   Basophils Absolute 0.0 0.0 - 0.1 K/uL   Immature Granulocytes 1 %   Abs Immature Granulocytes 0.08 (H) 0.00 - 0.07 K/uL  Troponin I (High Sensitivity)     Status: None   Collection Time: 04/21/19 10:23 PM  Result Value Ref Range   Troponin I (High Sensitivity) 4 <18 ng/L  Osmolality     Status: Abnormal   Collection Time: 04/21/19 10:23 PM  Result Value Ref Range   Osmolality 255 (L) 275 - 295 mOsm/kg  Urinalysis, Routine w reflex microscopic     Status: Abnormal   Collection Time: 04/21/19 11:05 PM  Result Value Ref Range   Color, Urine YELLOW YELLOW   APPearance CLEAR CLEAR   Specific Gravity, Urine <1.005 (L) 1.005 - 1.030   pH 7.5 5.0 - 8.0   Glucose, UA NEGATIVE NEGATIVE mg/dL   Hgb urine dipstick NEGATIVE NEGATIVE   Bilirubin Urine NEGATIVE NEGATIVE   Ketones, ur NEGATIVE NEGATIVE mg/dL   Protein, ur NEGATIVE NEGATIVE mg/dL   Nitrite NEGATIVE NEGATIVE   Leukocytes,Ua TRACE (A) NEGATIVE  Urinalysis, Microscopic (reflex)     Status: Abnormal   Collection Time: 04/21/19 11:05  PM  Result Value Ref Range   RBC / HPF 0-5 0 - 5 RBC/hpf   WBC, UA 0-5 0 - 5 WBC/hpf   Bacteria, UA RARE (A) NONE SEEN   Squamous Epithelial / LPF 0-5 0 - 5  Sodium, urine, random     Status: None   Collection Time: 04/21/19 11:13 PM  Result Value Ref Range   Sodium, Ur 19 mmol/L  Magnesium     Status: None   Collection Time: 04/22/19  1:03 AM  Result Value Ref Range   Magnesium 2.1 1.7 - 2.4 mg/dL  Phosphorus     Status: None   Collection Time: 04/22/19  1:03 AM  Result Value Ref Range   Phosphorus 2.9 2.5 - 4.6 mg/dL  Brain natriuretic peptide     Status: None   Collection Time: 04/22/19  1:03 AM  Result Value Ref Range   B Natriuretic Peptide 32.2 0.0 - 100.0 pg/mL  CBC WITH DIFFERENTIAL     Status: Abnormal   Collection Time: 04/22/19  4:19 AM  Result Value Ref Range   WBC 11.2 (H) 4.0 - 10.5 K/uL   RBC 4.65 3.87 - 5.11 MIL/uL   Hemoglobin 14.4 12.0 - 15.0 g/dL   HCT 41.8 36.0 - 46.0 %   MCV 89.9 80.0 - 100.0 fL   MCH 31.0 26.0 - 34.0 pg   MCHC 34.4 30.0 - 36.0 g/dL   RDW 12.2 11.5 - 15.5 %   Platelets 335 150 - 400 K/uL   nRBC 0.0 0.0 - 0.2 %   Neutrophils Relative % 73 %   Neutro Abs 8.2 (H) 1.7 - 7.7 K/uL   Lymphocytes Relative 20 %   Lymphs Abs 2.2 0.7 - 4.0 K/uL   Monocytes Relative 6 %   Monocytes Absolute 0.7 0.1 - 1.0 K/uL   Eosinophils Relative 0 %   Eosinophils Absolute 0.0 0.0 - 0.5 K/uL   Basophils Relative 0 %   Basophils Absolute 0.0 0.0 - 0.1 K/uL   Immature Granulocytes 1 %   Abs Immature Granulocytes 0.07 0.00 - 0.07 K/uL  Comprehensive metabolic panel     Status: Abnormal   Collection Time: 04/22/19  4:19 AM  Result Value Ref Range   Sodium 128 (L) 135 - 145 mmol/L   Potassium 4.2 3.5 - 5.1 mmol/L   Chloride 102 98 - 111 mmol/L   CO2 18 (L) 22 - 32 mmol/L   Glucose, Bld 127 (H) 70 - 99 mg/dL   BUN 7 (L) 8 - 23 mg/dL   Creatinine, Ser 0.42 (L) 0.44 - 1.00 mg/dL   Calcium 8.3 (L) 8.9 - 10.3 mg/dL   Total Protein 7.0 6.5 - 8.1 g/dL    Albumin 3.9 3.5 - 5.0 g/dL   AST 26 15 - 41 U/L   ALT 22 0 - 44 U/L   Alkaline Phosphatase 68 38 - 126 U/L   Total Bilirubin 1.1 0.3 - 1.2 mg/dL   GFR calc non Af Amer >60 >60 mL/min   GFR calc Af Amer >60 >60 mL/min   Anion gap 8 5 - 15   Imaging Studies: No results found.  ED COURSE and MDM  Nursing notes and initial vitals signs, including pulse oximetry, reviewed.  Vitals:   04/22/19 0030 04/22/19 0100 04/22/19 0234 04/22/19 0524  BP: (!) 141/86 (!) 148/92 (!) 176/88 135/84  Pulse: 75 73 72 74  Resp: 14 16 18 20   Temp:   98.8 F (37.1 C) 98.5 F (36.9 C)  TempSrc:   Oral Oral  SpO2: 100% 100% 100% 98%  Weight:    44.6 kg  Height:       6:56 AM Potassium repletion begun.  Patient placed on normal saline infusion at 150 mL/h.  Will have patient admitted.  It is unclear how rapidly her hypo-natremia developed as we have no previous laboratory studies to compare to.  PROCEDURES    ED DIAGNOSES     ICD-10-CM   1. Hyponatremia  E87.1   2.  Hypokalemia  E87.6        Jeanita Carneiro, Jenny Reichmann, MD 04/22/19 [phone]

## 2019-04-22 ENCOUNTER — Observation Stay (HOSPITAL_BASED_OUTPATIENT_CLINIC_OR_DEPARTMENT_OTHER): Payer: Medicare HMO

## 2019-04-22 ENCOUNTER — Encounter (HOSPITAL_COMMUNITY): Payer: Self-pay | Admitting: Internal Medicine

## 2019-04-22 DIAGNOSIS — E876 Hypokalemia: Secondary | ICD-10-CM | POA: Diagnosis present

## 2019-04-22 DIAGNOSIS — D72829 Elevated white blood cell count, unspecified: Secondary | ICD-10-CM | POA: Diagnosis not present

## 2019-04-22 DIAGNOSIS — R9431 Abnormal electrocardiogram [ECG] [EKG]: Secondary | ICD-10-CM | POA: Diagnosis not present

## 2019-04-22 DIAGNOSIS — E871 Hypo-osmolality and hyponatremia: Secondary | ICD-10-CM | POA: Diagnosis present

## 2019-04-22 DIAGNOSIS — F419 Anxiety disorder, unspecified: Secondary | ICD-10-CM | POA: Diagnosis present

## 2019-04-22 DIAGNOSIS — I10 Essential (primary) hypertension: Secondary | ICD-10-CM | POA: Diagnosis present

## 2019-04-22 DIAGNOSIS — R002 Palpitations: Secondary | ICD-10-CM | POA: Diagnosis present

## 2019-04-22 DIAGNOSIS — E785 Hyperlipidemia, unspecified: Secondary | ICD-10-CM | POA: Diagnosis not present

## 2019-04-22 DIAGNOSIS — Z79899 Other long term (current) drug therapy: Secondary | ICD-10-CM | POA: Diagnosis not present

## 2019-04-22 DIAGNOSIS — R69 Illness, unspecified: Secondary | ICD-10-CM | POA: Diagnosis not present

## 2019-04-22 DIAGNOSIS — Z1159 Encounter for screening for other viral diseases: Secondary | ICD-10-CM | POA: Diagnosis not present

## 2019-04-22 LAB — COMPREHENSIVE METABOLIC PANEL
ALT: 22 U/L (ref 0–44)
AST: 26 U/L (ref 15–41)
Albumin: 3.9 g/dL (ref 3.5–5.0)
Alkaline Phosphatase: 68 U/L (ref 38–126)
Anion gap: 8 (ref 5–15)
BUN: 7 mg/dL — ABNORMAL LOW (ref 8–23)
CO2: 18 mmol/L — ABNORMAL LOW (ref 22–32)
Calcium: 8.3 mg/dL — ABNORMAL LOW (ref 8.9–10.3)
Chloride: 102 mmol/L (ref 98–111)
Creatinine, Ser: 0.42 mg/dL — ABNORMAL LOW (ref 0.44–1.00)
GFR calc Af Amer: >60 mL/min (ref >60)
GFR calc non Af Amer: >60 mL/min (ref >60)
Glucose, Bld: 127 mg/dL — ABNORMAL HIGH (ref 70–99)
Potassium: 4.2 mmol/L (ref 3.5–5.1)
Sodium: 128 mmol/L — ABNORMAL LOW (ref 135–145)
Total Bilirubin: 1.1 mg/dL (ref 0.3–1.2)
Total Protein: 7 g/dL (ref 6.5–8.1)

## 2019-04-22 LAB — CBC WITH DIFFERENTIAL/PLATELET
Abs Immature Granulocytes: 0.07 10*3/uL (ref 0.00–0.07)
Basophils Absolute: 0 10*3/uL (ref 0.0–0.1)
Basophils Relative: 0 %
Eosinophils Absolute: 0 10*3/uL (ref 0.0–0.5)
Eosinophils Relative: 0 %
HCT: 41.8 % (ref 36.0–46.0)
Hemoglobin: 14.4 g/dL (ref 12.0–15.0)
Immature Granulocytes: 1 %
Lymphocytes Relative: 20 %
Lymphs Abs: 2.2 10*3/uL (ref 0.7–4.0)
MCH: 31 pg (ref 26.0–34.0)
MCHC: 34.4 g/dL (ref 30.0–36.0)
MCV: 89.9 fL (ref 80.0–100.0)
Monocytes Absolute: 0.7 10*3/uL (ref 0.1–1.0)
Monocytes Relative: 6 %
Neutro Abs: 8.2 10*3/uL — ABNORMAL HIGH (ref 1.7–7.7)
Neutrophils Relative %: 73 %
Platelets: 335 10*3/uL (ref 150–400)
RBC: 4.65 MIL/uL (ref 3.87–5.11)
RDW: 12.2 % (ref 11.5–15.5)
WBC: 11.2 10*3/uL — ABNORMAL HIGH (ref 4.0–10.5)
nRBC: 0 % (ref 0.0–0.2)

## 2019-04-22 LAB — ECHOCARDIOGRAM COMPLETE
Height: 59 in
Weight: 1573.2 oz

## 2019-04-22 LAB — MAGNESIUM: Magnesium: 2.1 mg/dL (ref 1.7–2.4)

## 2019-04-22 LAB — PHOSPHORUS: Phosphorus: 2.9 mg/dL (ref 2.5–4.6)

## 2019-04-22 LAB — SODIUM, URINE, RANDOM: Sodium, Ur: 19 mmol/L

## 2019-04-22 LAB — BRAIN NATRIURETIC PEPTIDE: B Natriuretic Peptide: 32.2 pg/mL (ref 0.0–100.0)

## 2019-04-22 LAB — SARS CORONAVIRUS 2 (TAT 6-24 HRS): SARS Coronavirus 2: NEGATIVE

## 2019-04-22 LAB — OSMOLALITY: Osmolality: 255 mOsm/kg — ABNORMAL LOW (ref 275–295)

## 2019-04-22 MED ORDER — MAGNESIUM SULFATE 2 GM/50ML IV SOLN
2.0000 g | Freq: Once | INTRAVENOUS | Status: AC
  Administered 2019-04-22: 2 g via INTRAVENOUS
  Filled 2019-04-22: qty 50

## 2019-04-22 MED ORDER — ALPRAZOLAM 0.25 MG PO TABS
0.2500 mg | ORAL_TABLET | Freq: Every evening | ORAL | Status: DC | PRN
  Administered 2019-04-22 (×2): 0.25 mg via ORAL
  Filled 2019-04-22 (×2): qty 1

## 2019-04-22 MED ORDER — PROCHLORPERAZINE EDISYLATE 10 MG/2ML IJ SOLN: 5.0000 mg | INTRAMUSCULAR | Status: DC | PRN

## 2019-04-22 MED ORDER — POTASSIUM CHLORIDE IN NACL 40-0.9 MEQ/L-% IV SOLN
INTRAVENOUS | Status: DC
  Administered 2019-04-22 – 2019-04-23 (×3): 75 mL/h via INTRAVENOUS
  Filled 2019-04-22 (×5): qty 1000

## 2019-04-22 MED ORDER — HYDRALAZINE HCL 20 MG/ML IJ SOLN
10.0000 mg | Freq: Four times a day (QID) | INTRAMUSCULAR | Status: DC | PRN
  Administered 2019-04-22 – 2019-04-23 (×2): 10 mg via INTRAVENOUS
  Filled 2019-04-22 (×2): qty 1

## 2019-04-22 MED ORDER — ATENOLOL 50 MG PO TABS
50.0000 mg | ORAL_TABLET | Freq: Two times a day (BID) | ORAL | Status: DC
  Administered 2019-04-22 – 2019-04-23 (×4): 50 mg via ORAL
  Filled 2019-04-22 (×4): qty 1

## 2019-04-22 MED ORDER — ACETAMINOPHEN 325 MG PO TABS: 650.0000 mg | ORAL_TABLET | Freq: Four times a day (QID) | ORAL | Status: DC | PRN

## 2019-04-22 MED ORDER — ENOXAPARIN SODIUM 30 MG/0.3ML ~~LOC~~ SOLN
30.0000 mg | Freq: Every day | SUBCUTANEOUS | Status: DC
  Administered 2019-04-22: 30 mg via SUBCUTANEOUS
  Filled 2019-04-22 (×2): qty 0.3

## 2019-04-22 MED ORDER — ALUM & MAG HYDROXIDE-SIMETH 200-200-20 MG/5ML PO SUSP
30.0000 mL | Freq: Four times a day (QID) | ORAL | Status: DC | PRN
  Administered 2019-04-22: 30 mL via ORAL
  Filled 2019-04-22: qty 30

## 2019-04-22 MED ORDER — ACETAMINOPHEN 650 MG RE SUPP: 650.0000 mg | Freq: Four times a day (QID) | RECTAL | Status: DC | PRN

## 2019-04-22 NOTE — H&P (Signed)
History and Physical    Jenna [name] ATF:[id number] DOB: Oct 07, 1950 DOA: 04/21/2019  PCP: Lennie Odor, PA-C  Patient coming from: Home.  I have personally briefly reviewed patient's old medical records in [place]  Chief Complaint: Dizziness and palpitations.  HPI: Jenna [name] is a 68 y.o. female with medical history significant of anxiety, hypertension, hyperlipidemia who is coming to the emergency department due to dizziness, irregular heartbeat, hand sweating, insomnia and facial flush after her Celexa was recently increased from 10 to 20 mg p.o. daily.  She denies headache, fever, chills, rhinorrhea, sore throat, chest pain, dyspnea, PND, orthopnea or pitting edema of the lower extremities.  No abdominal pain, nausea or emesis, diarrhea or constipation, melena or hematochezia.  No dysuria, frequency or hematuria.  The patient states that she drinks a lot of water, but denies polyphagia, blurred vision or polyuria.  ED Course: Initial vital signs temperature 98.9 F, pulse 80, respiration 18, blood pressure 201/109 mmHg and O2 sat 100% on room air.  The patient received 60 mEq of potassium replacement in the ED.  Her urinalysis show low a specific gravity and trace leukocyte esterase.  White count is 13.0 with 69% neutrophils and 21% lymphocytes.  Hemoglobin 15.0 g/dL and platelets 272.  Sodium 123, potassium 2.8, chloride 90 and CO2 20 mmol/L.  Renal function is normal.  Nonfasting glucose 134 mg/dL.  High-sensitivity troponin was normal.  Urine sodium was 19.  Osmolality was 255.  Phosphorus, magnesium and BNP were within normal limits  Review of Systems: As per HPI otherwise 10 point review of systems negative.   Past Medical History:  Diagnosis Date  . Anxiety   . Hypertension     No past surgical history on file.   reports that she has never smoked. She has never used smokeless tobacco. She reports previous alcohol use. She reports previous drug use.  No  Known Allergies  Family History  Problem Relation Age of Onset  . Hypertension Mother    Prior to Admission medications   Medication Sig Start Date End Date Taking? Authorizing Provider  ALPRAZolam Duanne Moron) 0.25 MG tablet  02/25/16   [provider]  atenolol (TENORMIN) 50 MG tablet  02/14/16   [provider]  citalopram (CELEXA) 10 MG tablet  02/23/16   [provider]  ibandronate (BONIVA) 150 MG tablet  02/14/16   [provider]  lisinopril-hydrochlorothiazide (PRINZIDE,ZESTORETIC) 20-25 MG tablet  01/23/16   [provider]  oxyCODONE-acetaminophen (PERCOCET) 10-325 MG tablet Take 1 tablet by mouth every 4 (four) hours as needed for pain.    Wallene Huh, [place]    Physical Exam: Vitals:   04/22/19 0000 04/22/19 0030 04/22/19 0100 04/22/19 0234  BP: (!) 152/85 (!) 141/86 (!) 148/92 (!) 176/88  Pulse: 71 75 73 72  Resp: 14 14 16 18   Temp:    98.8 F (37.1 C)  TempSrc:    Oral  SpO2: 100% 100% 100% 100%  Weight:      Height:        Constitutional: NAD, calm, comfortable Eyes: PERRL, lids and conjunctivae normal ENMT: Mucous membranes are moist. Posterior pharynx clear of any exudate or lesions. Neck: normal, supple, no masses, no thyromegaly Respiratory: clear to auscultation bilaterally, no wheezing, no crackles. Normal respiratory effort. No accessory muscle use.  Cardiovascular: Regular rate and rhythm, no murmurs / rubs / gallops. No extremity edema. 2+ pedal pulses. No carotid bruits.  Abdomen: no tenderness, no masses palpated. No  hepatosplenomegaly. Bowel sounds positive.  Musculoskeletal: no clubbing / cyanosis.  Good ROM, no contractures. Normal muscle tone.  Skin: no rashes, lesions, ulcers. No induration on very limited dermatological examination. Neurologic: CN 2-12 grossly intact. Sensation intact, DTR normal. Strength 5/5 in all 4.  Psychiatric: Normal judgment and insight. Alert and oriented x 3. Normal mood.    Labs  on Admission: I have personally reviewed following labs and imaging studies  CBC: Recent Labs  Lab 04/21/19 2223  WBC 13.0*  NEUTROABS 9.0*  HGB 15.0  HCT 42.4  MCV 86.5  PLT 128   Basic Metabolic Panel: Recent Labs  Lab 04/21/19 2223 04/22/19 0103  NA 123*  --   K 2.8*  --   CL 90*  --   CO2 20*  --   GLUCOSE 134*  --   BUN 10  --   CREATININE 0.55  --   CALCIUM 9.2  --   MG  --  2.1  PHOS  --  2.9   GFR: Estimated Creatinine Clearance: 46.5 mL/min (by C-G formula based on SCr of 0.55 mg/dL). Liver Function Tests: No results for input(s): AST, ALT, ALKPHOS, BILITOT, PROT, ALBUMIN in the last 168 hours. No results for input(s): LIPASE, AMYLASE in the last 168 hours. No results for input(s): AMMONIA in the last 168 hours. Coagulation Profile: No results for input(s): INR, PROTIME in the last 168 hours. Cardiac Enzymes: No results for input(s): CKTOTAL, CKMB, CKMBINDEX, TROPONINI in the last 168 hours. BNP (last 3 results) No results for input(s): PROBNP in the last 8760 hours. HbA1C: No results for input(s): HGBA1C in the last 72 hours. CBG: No results for input(s): GLUCAP in the last 168 hours. Lipid Profile: No results for input(s): CHOL, HDL, LDLCALC, TRIG, CHOLHDL, LDLDIRECT in the last 72 hours. Thyroid Function Tests: No results for input(s): TSH, T4TOTAL, FREET4, T3FREE, THYROIDAB in the last 72 hours. Anemia Panel: No results for input(s): VITAMINB12, FOLATE, FERRITIN, TIBC, IRON, RETICCTPCT in the last 72 hours. Urine analysis:    Component Value Date/Time   COLORURINE YELLOW 04/21/2019 2305   APPEARANCEUR CLEAR 04/21/2019 2305   LABSPEC <1.005 (L) 04/21/2019 2305   PHURINE 7.5 04/21/2019 2305   GLUCOSEU NEGATIVE 04/21/2019 2305   HGBUR NEGATIVE 04/21/2019 2305   BILIRUBINUR NEGATIVE 04/21/2019 2305   KETONESUR NEGATIVE 04/21/2019 2305   PROTEINUR NEGATIVE 04/21/2019 2305   NITRITE NEGATIVE 04/21/2019 2305   LEUKOCYTESUR TRACE (A) 04/21/2019  2305    Radiological Exams on Admission: No results found.  EKG: Independently reviewed.  Vent. rate 82 BPM PR interval * ms QRS duration 93 ms QT/QTc 397/464 ms P-R-T axes 53 39 46 Sinus rhythm Consider left ventricular hypertrophy Baseline wander in lead(s) V1  Assessment/Plan Principal Problem:   Hyponatremia Observation/telemetry. Continue normal saline infusion. Hold hydrochlorothiazide. Fluid restriction. Follow-up sodium level.  Active Problems:   Hypokalemia Replacing. Follow-up potassium level.    Leukocytosis Unknown significance. Follow CBC in a.m.    Palpitations Likely combination of higher citalopram dose. Along with electrolyte abnormalities. Continue electrolyte correction. Check echocardiogram.    Anxiety Continue alprazolam as needed. Consider decreasing or tapering off citalopram. Given insomnia, the patient may benefit from trazodone or mirtazapine.    Hypertension Continue atenolol 50 mg p.o. twice daily. Resume lisinopril later this morning. Hold hydrochlorothiazide.   DVT prophylaxis: Lovenox SQ. Code Status: Full code. Family Communication: Disposition Plan: Observation for electrolyte replacement and further work-up.  Consults called: Admission status: Inpatient/telemetry.   Reubin Milan MD  [facility]  If 7PM-7AM, please contact night-coverage [email or web address]  04/22/2019, 3:41 AM   This document was prepared using Dragon voice recognition software and may contain some unintended transcription errors.

## 2019-04-22 NOTE — Progress Notes (Signed)
  Echocardiogram 2D Echocardiogram has been performed.  Jenna [name] M 04/22/2019, 8:37 AM

## 2019-04-22 NOTE — ED Notes (Signed)
ED Provider at bedside. 

## 2019-04-22 NOTE — ED Notes (Signed)
Jenna [name] (Husband) [phone]

## 2019-04-23 ENCOUNTER — Encounter (HOSPITAL_COMMUNITY): Payer: Self-pay | Admitting: *Deleted

## 2019-04-23 DIAGNOSIS — E871 Hypo-osmolality and hyponatremia: Secondary | ICD-10-CM

## 2019-04-23 LAB — BASIC METABOLIC PANEL WITH GFR
Anion gap: 8 (ref 5–15)
BUN: 7 mg/dL — ABNORMAL LOW (ref 8–23)
CO2: 17 mmol/L — ABNORMAL LOW (ref 22–32)
Calcium: 7.8 mg/dL — ABNORMAL LOW (ref 8.9–10.3)
Chloride: 106 mmol/L (ref 98–111)
Creatinine, Ser: 0.35 mg/dL — ABNORMAL LOW (ref 0.44–1.00)
GFR calc Af Amer: >60 mL/min
GFR calc non Af Amer: >60 mL/min
Glucose, Bld: 109 mg/dL — ABNORMAL HIGH (ref 70–99)
Potassium: 4 mmol/L (ref 3.5–5.1)
Sodium: 131 mmol/L — ABNORMAL LOW (ref 135–145)

## 2019-04-23 MED ORDER — ACETAMINOPHEN 325 MG PO TABS: 650.0000 mg | ORAL_TABLET | Freq: Four times a day (QID) | ORAL | 0 refills | Status: DC | PRN

## 2019-04-23 MED ORDER — AMLODIPINE BESYLATE 10 MG PO TABS: 10.0000 mg | ORAL_TABLET | Freq: Every day | ORAL | 3 refills | Status: AC

## 2019-04-23 MED ORDER — ATENOLOL 50 MG PO TABS: 50.0000 mg | ORAL_TABLET | Freq: Two times a day (BID) | ORAL | 5 refills | Status: AC

## 2019-04-23 MED ORDER — LISINOPRIL 20 MG PO TABS: 20.0000 mg | ORAL_TABLET | Freq: Every day | ORAL | 11 refills | Status: AC

## 2019-04-23 NOTE — Progress Notes (Signed)
Orders for discharge have been entered. Awaiting completion of IVF to complete instruction per M.D recommendation.

## 2019-04-23 NOTE — Discharge Instructions (Signed)
1) please stop HCTZ/hydrochlorthiazide due to electrolyte abnormalities including low sodium and low potassium 2) please follow-up with your primary care physician within a week for recheck of your BMP/kidney and electrolyte test 3) the rest of your blood pressure medications have been adjusted in order to allow for better control of your blood pressure 4) avoid drinking too much free water at least over the next 3 to 5 days to allow your sodium to continue to rise, after that you may be able to return to your normal amount of water intake

## 2019-04-23 NOTE — Discharge Summary (Signed)
Jenna [name], is a 68 y.o. female  DOB [birth year]  MRN [id number].  Admission date:  04/21/2019  Admitting Physician  Reubin Milan, MD  Discharge Date:  04/23/2019   Primary MD  Lennie Odor, PA-C  Recommendations for primary care physician for things to follow:   1)Please stop HCTZ/hydrochlorthiazide due to electrolyte abnormalities including low sodium and low potassium 2)Please follow-up with your primary care physician within a week for recheck of your BMP/kidney and electrolyte test 3) the rest of your blood pressure medications have been adjusted in order to allow for better control of your blood pressure 4)Avoid drinking too much free water at least over the next 3 to 5 days to allow your sodium to continue to rise, after that you may be able to return to your normal amount of water intake   Admission Diagnosis  Hypokalemia [E87.6] Hyponatremia [E87.1]   Discharge Diagnosis  Hypokalemia [E87.6] Hyponatremia [E87.1]    Principal Problem:   Hyponatremia Active Problems:   Hypokalemia   Leukocytosis   Palpitations   Anxiety   Hypertension      Past Medical History:  Diagnosis Date   Anxiety    Hypertension      HPI  from the history and physical done on the day of admission:    Patient coming from: Home.  I have personally briefly reviewed patient's old medical records in [place]  Chief Complaint: Dizziness and palpitations.  HPI: Jenna [name] is a 68 y.o. female with medical history significant of anxiety, hypertension, hyperlipidemia who is coming to the emergency department due to dizziness, irregular heartbeat, hand sweating, insomnia and facial flush after her Celexa was recently increased from 10 to 20 mg p.o. daily.  She denies headache, fever, chills, rhinorrhea, sore throat, chest pain, dyspnea, PND, orthopnea or pitting edema of the lower  extremities.  No abdominal pain, nausea or emesis, diarrhea or constipation, melena or hematochezia.  No dysuria, frequency or hematuria.  The patient states that she drinks a lot of water, but denies polyphagia, blurred vision or polyuria.  ED Course: Initial vital signs temperature 98.9 F, pulse 80, respiration 18, blood pressure 201/109 mmHg and O2 sat 100% on room air.  The patient received 60 mEq of potassium replacement in the ED.  Her urinalysis show low a specific gravity and trace leukocyte esterase.  White count is 13.0 with 69% neutrophils and 21% lymphocytes.  Hemoglobin 15.0 g/dL and platelets 272.  Sodium 123, potassium 2.8, chloride 90 and CO2 20 mmol/L.  Renal function is normal.  Nonfasting glucose 134 mg/dL.  High-sensitivity troponin was normal.  Urine sodium was 19.  Osmolality was 255.  Phosphorus, magnesium and BNP were within normal limits     Hospital Course:       Hyponatremia ---- Sodium improved to 131 from 123 with discontinuation of HCTZ and IV normal saline infusion --Repeat sodium with PCP next week consider discontinuing Celexa if hyponatremia persist after discontinuation of HCTZ --Avoid excessive free water  -  Hypokalemia -- Due to HCTZ use, replaced and normalized    Anxiety disorder/palpitations --Echocardiogram with preserved EF over 65%, no significant abnormalities -Continue Celexa . Hypertension--stage II -BP is not at goal so -Increase amlodipine to 10 mg daily for better BP control and Resume lisinopril ,  Continue atenolol 50 mg p.o. twice daily. -Stop hydrochlorothiazide.    Code Status: Full code.  Discharge Condition: Stable  Follow UP--PCP for BMP recheck within a week   Diet and Activity recommendation:  As advised  Discharge Instructions    Discharge Instructions    Call MD for:  difficulty breathing, headache or visual disturbances   Complete by: As directed    Call MD for:  extreme fatigue   Complete by: As  directed    Call MD for:  persistant dizziness or light-headedness   Complete by: As directed    Call MD for:  persistant nausea and vomiting   Complete by: As directed    Call MD for:  severe uncontrolled pain   Complete by: As directed    Call MD for:  temperature >100.4   Complete by: As directed    Diet - low sodium heart healthy   Complete by: As directed    Discharge instructions   Complete by: As directed    1) please stop HCTZ/hydrochlorthiazide due to electrolyte abnormalities including low sodium and low potassium 2) please follow-up with your primary care physician within a week for recheck of your BMP/kidney and electrolyte test 3) the rest of your blood pressure medications have been adjusted in order to allow for better control of your blood pressure 4) avoid drinking too much free water at least over the next 3 to 5 days to allow your sodium to continue to rise, after that you may be able to return to your normal amount of water intake   Increase activity slowly   Complete by: As directed         Discharge Medications     Allergies as of 04/23/2019   No Known Allergies     Medication List    STOP taking these medications   lisinopril-hydrochlorothiazide 20-25 MG tablet Commonly known as: ZESTORETIC     TAKE these medications   acetaminophen 325 MG tablet Commonly known as: TYLENOL Take 2 tablets (650 mg total) by mouth every 6 (six) hours as needed for mild pain or fever (or Fever >/= 101).   amLODipine 10 MG tablet Commonly known as: NORVASC Take 1 tablet (10 mg total) by mouth daily. For BP What changed:   medication strength  how much to take  additional instructions   atenolol 50 MG tablet Commonly known as: TENORMIN Take 1 tablet (50 mg total) by mouth 2 (two) times daily. For BP What changed:   how much to take  when to take this  additional instructions   atorvastatin 20 MG tablet Commonly known as: LIPITOR Take 20 mg by mouth  daily.   calcium-vitamin D 500-200 MG-UNIT tablet Commonly known as: OSCAL WITH D Take 1 tablet by mouth daily.   citalopram 10 MG tablet Commonly known as: CELEXA Take 10 mg by mouth daily.   lisinopril 20 MG tablet Commonly known as: ZESTRIL Take 1 tablet (20 mg total) by mouth daily. For BP      Major procedures and Radiology Reports - PLEASE review detailed and final reports for all details, in brief -   No results found.  Micro Results   Recent Results (from the  past 240 hour(s))  SARS CORONAVIRUS 2 Nasal Swab Aptima Multi Swab     Status: None   Collection Time: 04/22/19 12:56 AM   Specimen: Aptima Multi Swab; Nasal Swab  Result Value Ref Range Status   SARS Coronavirus 2 NEGATIVE NEGATIVE Final    Comment: (NOTE) SARS-CoV-2 target nucleic acids are NOT DETECTED. The SARS-CoV-2 RNA is generally detectable in upper and lower respiratory specimens during the acute phase of infection. Negative results do not preclude SARS-CoV-2 infection, do not rule out co-infections with other pathogens, and should not be used as the sole basis for treatment or other patient management decisions. Negative results must be combined with clinical observations, patient history, and epidemiological information. The expected result is Negative. Fact Sheet for Patients: SugarRoll.be Fact Sheet for Healthcare Providers: https://www.woods-mathews.com/ This test is not yet approved or cleared by the [place] FDA and  has been authorized for detection and/or diagnosis of SARS-CoV-2 by FDA under an Emergency Use Authorization (EUA). This EUA will remain  in effect (meaning this test can be used) for the duration of the COVID-19 declaration under Section 56 4(b)(1) of the Act, 21 U.S.C. section 360bbb-3(b)(1), unless the authorization is terminated or revoked sooner. Performed at [facility], [place] [address] [place], [place] [address]      Today   Subjective    Jenna [name] today has no new complaints, no dizziness, no headaches, no visual disturbance, ambulating without DOE          Patient has been seen and examined prior to discharge   Objective   Blood pressure (!) 175/94, pulse 88, temperature 98.5 F (36.9 C), temperature source Oral, resp. rate 20, height 4\' 11"  (1.499 m), weight 44.6 kg, SpO2 98 %.   Intake/Output Summary (Last 24 hours) at 04/23/2019 1309 Last data filed at 04/22/2019 1817 Gross per 24 hour  Intake 1231.14 ml  Output --  Net 1231.14 ml    Exam Gen:- Awake Alert, no acute distress  HEENT:- [place].AT, No sclera icterus Neck-Supple Neck,No JVD,.  Lungs-  CTAB , good air movement bilaterally  CV- S1, S2 normal, regular Abd-  +ve B.Sounds, Abd Soft, No tenderness,    Extremity/Skin:- No  edema,   good pulses Psych-affect is appropriate, oriented x3 Neuro-no new focal deficits, no tremors    Data Review   CBC w Diff:  Lab Results  Component Value Date   WBC 11.2 (H) 04/22/2019   HGB 14.4 04/22/2019   HCT 41.8 04/22/2019   PLT 335 04/22/2019   LYMPHOPCT 20 04/22/2019   MONOPCT 6 04/22/2019   EOSPCT 0 04/22/2019   BASOPCT 0 04/22/2019    CMP:  Lab Results  Component Value Date   NA 131 (L) 04/23/2019   K 4.0 04/23/2019   CL 106 04/23/2019   CO2 17 (L) 04/23/2019   BUN 7 (L) 04/23/2019   CREATININE 0.35 (L) 04/23/2019   PROT 7.0 04/22/2019   ALBUMIN 3.9 04/22/2019   BILITOT 1.1 04/22/2019   ALKPHOS 68 04/22/2019   AST 26 04/22/2019   ALT 22 04/22/2019  .  Total Discharge time is about 33 minutes  Roxan Hockey M.D on 04/23/2019 at 1:09 PM  Go to [email or web address] -  for contact info  [facility] - Office  [phone]

## 2019-04-23 NOTE — Progress Notes (Signed)
Assumed care of patient from [facility]. Agree with previous RN assessment. Will continue to monitor.

## 2019-04-24 LAB — HIV ANTIBODY (ROUTINE TESTING W REFLEX): HIV Screen 4th Generation wRfx: NONREACTIVE

## 2019-04-29 ENCOUNTER — Other Ambulatory Visit: Payer: Self-pay

## 2019-04-29 ENCOUNTER — Encounter (HOSPITAL_BASED_OUTPATIENT_CLINIC_OR_DEPARTMENT_OTHER): Payer: Self-pay | Admitting: Emergency Medicine

## 2019-04-29 ENCOUNTER — Emergency Department (HOSPITAL_BASED_OUTPATIENT_CLINIC_OR_DEPARTMENT_OTHER)
Admission: EM | Admit: 2019-04-29 | Discharge: 2019-04-30 | Disposition: A | Discharge status: Home / Self Care | Payer: Medicare HMO | Attending: Emergency Medicine | Admitting: Emergency Medicine

## 2019-04-29 DIAGNOSIS — Z711 Person with feared health complaint in whom no diagnosis is made: Secondary | ICD-10-CM | POA: Diagnosis not present

## 2019-04-29 DIAGNOSIS — I10 Essential (primary) hypertension: Secondary | ICD-10-CM | POA: Diagnosis not present

## 2019-04-29 DIAGNOSIS — F419 Anxiety disorder, unspecified: Secondary | ICD-10-CM | POA: Diagnosis not present

## 2019-04-29 DIAGNOSIS — R42 Dizziness and giddiness: Secondary | ICD-10-CM | POA: Diagnosis not present

## 2019-04-29 DIAGNOSIS — Z79899 Other long term (current) drug therapy: Secondary | ICD-10-CM | POA: Insufficient documentation

## 2019-04-29 DIAGNOSIS — R69 Illness, unspecified: Secondary | ICD-10-CM | POA: Diagnosis not present

## 2019-04-29 DIAGNOSIS — R251 Tremor, unspecified: Secondary | ICD-10-CM | POA: Diagnosis present

## 2019-04-29 HISTORY — DX: Hypo-osmolality and hyponatremia: E87.1

## 2019-04-29 LAB — COMPREHENSIVE METABOLIC PANEL
ALT: 26 U/L (ref 0–44)
AST: 24 U/L (ref 15–41)
Albumin: 4.2 g/dL (ref 3.5–5.0)
Alkaline Phosphatase: 74 U/L (ref 38–126)
Anion gap: 10 (ref 5–15)
BUN: 11 mg/dL (ref 8–23)
CO2: 19 mmol/L — ABNORMAL LOW (ref 22–32)
Calcium: 8.8 mg/dL — ABNORMAL LOW (ref 8.9–10.3)
Chloride: 103 mmol/L (ref 98–111)
Creatinine, Ser: 0.49 mg/dL (ref 0.44–1.00)
GFR calc Af Amer: >60 mL/min (ref >60)
GFR calc non Af Amer: >60 mL/min (ref >60)
Glucose, Bld: 136 mg/dL — ABNORMAL HIGH (ref 70–99)
Potassium: 3.4 mmol/L — ABNORMAL LOW (ref 3.5–5.1)
Sodium: 132 mmol/L — ABNORMAL LOW (ref 135–145)
Total Bilirubin: 0.5 mg/dL (ref 0.3–1.2)
Total Protein: 7.3 g/dL (ref 6.5–8.1)

## 2019-04-29 LAB — CBC WITH DIFFERENTIAL/PLATELET
Abs Immature Granulocytes: 0.06 10*3/uL (ref 0.00–0.07)
Basophils Absolute: 0 10*3/uL (ref 0.0–0.1)
Basophils Relative: 0 %
Eosinophils Absolute: 0.1 10*3/uL (ref 0.0–0.5)
Eosinophils Relative: 1 %
HCT: 41.7 % (ref 36.0–46.0)
Hemoglobin: 14.1 g/dL (ref 12.0–15.0)
Immature Granulocytes: 1 %
Lymphocytes Relative: 20 %
Lymphs Abs: 2.2 10*3/uL (ref 0.7–4.0)
MCH: 30.7 pg (ref 26.0–34.0)
MCHC: 33.8 g/dL (ref 30.0–36.0)
MCV: 90.7 fL (ref 80.0–100.0)
Monocytes Absolute: 0.8 10*3/uL (ref 0.1–1.0)
Monocytes Relative: 7 %
Neutro Abs: 7.7 10*3/uL (ref 1.7–7.7)
Neutrophils Relative %: 71 %
Platelets: 367 10*3/uL (ref 150–400)
RBC: 4.6 MIL/uL (ref 3.87–5.11)
RDW: 12.8 % (ref 11.5–15.5)
WBC: 10.7 10*3/uL — ABNORMAL HIGH (ref 4.0–10.5)
nRBC: 0 % (ref 0.0–0.2)

## 2019-04-29 LAB — URINALYSIS, ROUTINE W REFLEX MICROSCOPIC
Bilirubin Urine: NEGATIVE
Glucose, UA: NEGATIVE mg/dL
Hgb urine dipstick: NEGATIVE
Ketones, ur: NEGATIVE mg/dL
Nitrite: NEGATIVE
Protein, ur: NEGATIVE mg/dL
Specific Gravity, Urine: 1.015 (ref 1.005–1.030)
pH: 8 (ref 5.0–8.0)

## 2019-04-29 LAB — URINALYSIS, MICROSCOPIC (REFLEX): RBC / HPF: NONE SEEN RBC/hpf (ref 0–5)

## 2019-04-29 LAB — CBG MONITORING, ED: Glucose-Capillary: 123 mg/dL — ABNORMAL HIGH (ref 70–99)

## 2019-04-29 MED ORDER — POTASSIUM CHLORIDE CRYS ER 20 MEQ PO TBCR
EXTENDED_RELEASE_TABLET | ORAL | Status: AC
  Filled 2019-04-29: qty 2

## 2019-04-29 MED ORDER — POTASSIUM CHLORIDE CRYS ER 20 MEQ PO TBCR
30.0000 meq | EXTENDED_RELEASE_TABLET | Freq: Once | ORAL | Status: AC
  Administered 2019-04-30: 30 meq via ORAL

## 2019-04-29 NOTE — ED Triage Notes (Signed)
Pt was admitted to hospital for hyponatremia last week. C/o feeling shaky, weak and not well.

## 2019-04-30 ENCOUNTER — Encounter (HOSPITAL_BASED_OUTPATIENT_CLINIC_OR_DEPARTMENT_OTHER): Payer: Self-pay | Admitting: Emergency Medicine

## 2019-04-30 NOTE — ED Provider Notes (Signed)
[facility] EMERGENCY DEPARTMENT Provider Note   CSN: [id number] Arrival date & time: 04/29/19  2142     History   Chief Complaint Chief Complaint  Patient presents with  . Shaking    HPI Jenna [name] is a 68 y.o. female.     The history is provided by the patient.  Illness Location:  Entire body Quality:  Feels jittery and shaky inside.  No actual tremors Severity:  Moderate Onset quality:  Gradual Duration:  1 week Timing:  Constant Progression:  Unchanged Chronicity:  New Context:  Had hyponatremia and was hospitalized and is afraid she is still ill. Still does not feel herself.   Relieved by:  Nothing Worsened by:  Thinking about it Ineffective treatments:  None tried Associated symptoms: no abdominal pain, no chest pain, no congestion, no cough, no diarrhea, no ear pain, no fatigue, no fever, no headaches, no loss of consciousness, no myalgias, no nausea, no rash, no rhinorrhea, no shortness of breath, no sore throat, no vomiting and no wheezing   Risk factors:  Anxiety Patient was admitted for hyponatremia and is concerned it did not resolve as she is still feeling shaky.  No f/c/r.  No weakness nor numbness.  No CP no cough, no SOB.    Past Medical History:  Diagnosis Date  . Anxiety   . Hypertension   . Hyponatremia     Patient Active Problem List   Diagnosis Date Noted  . Hyponatremia 04/22/2019  . Hypokalemia 04/22/2019  . Leukocytosis 04/22/2019  . Palpitations 04/22/2019  . Anxiety 04/22/2019  . Hypertension 04/22/2019    History reviewed. No pertinent surgical history.   OB History   No obstetric history on file.      Home Medications    Prior to Admission medications   Medication Sig Start Date End Date Taking? Authorizing Provider  acetaminophen (TYLENOL) 325 MG tablet Take 2 tablets (650 mg total) by mouth every 6 (six) hours as needed for mild pain or fever (or Fever >/= 101). 04/23/19   Roxan Hockey, MD  amLODipine  (NORVASC) 10 MG tablet Take 1 tablet (10 mg total) by mouth daily. For BP 04/23/19   Emokpae, Courage, MD  atenolol (TENORMIN) 50 MG tablet Take 1 tablet (50 mg total) by mouth 2 (two) times daily. For BP 04/23/19   Emokpae, Courage, MD  atorvastatin (LIPITOR) 20 MG tablet Take 20 mg by mouth daily.    [provider]  calcium-vitamin D (OSCAL WITH D) 500-200 MG-UNIT tablet Take 1 tablet by mouth daily.    [provider]  citalopram (CELEXA) 10 MG tablet Take 10 mg by mouth daily.  02/23/16   [provider]  lisinopril (ZESTRIL) 20 MG tablet Take 1 tablet (20 mg total) by mouth daily. For BP 04/23/19 04/22/20  Roxan Hockey, MD    Family History Family History  Problem Relation Age of Onset  . Hypertension Mother     Social History Social History   Tobacco Use  . Smoking status: Never Smoker  . Smokeless tobacco: Never Used  Substance Use Topics  . Alcohol use: Not Currently    Alcohol/week: 0.0 standard drinks  . Drug use: Not Currently     Allergies   Patient has no known allergies.   Review of Systems Review of Systems  Constitutional: Negative for fatigue and fever.  HENT: Negative for congestion, ear pain, rhinorrhea and sore throat.   Eyes: Negative for photophobia and visual disturbance.  Respiratory: Negative  for cough, shortness of breath and wheezing.   Cardiovascular: Negative for chest pain, palpitations and leg swelling.  Gastrointestinal: Negative for abdominal pain, diarrhea, nausea and vomiting.  Genitourinary: Negative for difficulty urinating.  Musculoskeletal: Negative for myalgias.  Skin: Negative for rash.  Neurological: Negative for loss of consciousness and headaches.  Psychiatric/Behavioral: The patient is nervous/anxious.   All other systems reviewed and are negative.    Physical Exam Updated Vital Signs BP (!) 155/92   Pulse 76   Temp 98.2 F (36.8 C) (Oral)   Resp 20   Ht 4' 10.5" (1.486 m)   Wt 43.1 kg    SpO2 100%   BMI 19.52 kg/m   Physical Exam Vitals signs and nursing note reviewed.  Constitutional:      General: She is not in acute distress.    Appearance: She is normal weight. She is not ill-appearing, toxic-appearing or diaphoretic.  HENT:     Head: Normocephalic and atraumatic.     Nose: Nose normal.     Mouth/Throat:     Mouth: Mucous membranes are moist.     Pharynx: Oropharynx is clear.  Eyes:     Conjunctiva/sclera: Conjunctivae normal.     Pupils: Pupils are equal, round, and reactive to light.  Neck:     Musculoskeletal: Normal range of motion and neck supple.  Cardiovascular:     Rate and Rhythm: Normal rate and regular rhythm.     Pulses: Normal pulses.     Heart sounds: Normal heart sounds.  Pulmonary:     Effort: Pulmonary effort is normal.     Breath sounds: Normal breath sounds. No wheezing or rales.  Abdominal:     General: Abdomen is flat. Bowel sounds are normal.     Tenderness: There is no abdominal tenderness. There is no guarding or rebound.  Musculoskeletal: Normal range of motion.     Right lower leg: No edema.     Left lower leg: No edema.  Skin:    General: Skin is warm and dry.     Capillary Refill: Capillary refill takes less than 2 seconds.  Neurological:     General: No focal deficit present.     Mental Status: She is alert and oriented to person, place, and time.     Deep Tendon Reflexes: Reflexes normal.  Psychiatric:        Mood and Affect: Mood is anxious.      ED Treatments / Results  Labs (all labs ordered are listed, but only abnormal results are displayed) Results for orders placed or performed during the hospital encounter of 04/29/19  CBC with Differential  Result Value Ref Range   WBC 10.7 (H) 4.0 - 10.5 K/uL   RBC 4.60 3.87 - 5.11 MIL/uL   Hemoglobin 14.1 12.0 - 15.0 g/dL   HCT 41.7 36.0 - 46.0 %   MCV 90.7 80.0 - 100.0 fL   MCH 30.7 26.0 - 34.0 pg   MCHC 33.8 30.0 - 36.0 g/dL   RDW 12.8 11.5 - 15.5 %   Platelets  367 150 - 400 K/uL   nRBC 0.0 0.0 - 0.2 %   Neutrophils Relative % 71 %   Neutro Abs 7.7 1.7 - 7.7 K/uL   Lymphocytes Relative 20 %   Lymphs Abs 2.2 0.7 - 4.0 K/uL   Monocytes Relative 7 %   Monocytes Absolute 0.8 0.1 - 1.0 K/uL   Eosinophils Relative 1 %   Eosinophils Absolute 0.1 0.0 - 0.5 K/uL  Basophils Relative 0 %   Basophils Absolute 0.0 0.0 - 0.1 K/uL   Immature Granulocytes 1 %   Abs Immature Granulocytes 0.06 0.00 - 0.07 K/uL  Comprehensive metabolic panel  Result Value Ref Range   Sodium 132 (L) 135 - 145 mmol/L   Potassium 3.4 (L) 3.5 - 5.1 mmol/L   Chloride 103 98 - 111 mmol/L   CO2 19 (L) 22 - 32 mmol/L   Glucose, Bld 136 (H) 70 - 99 mg/dL   BUN 11 8 - 23 mg/dL   Creatinine, Ser 0.49 0.44 - 1.00 mg/dL   Calcium 8.8 (L) 8.9 - 10.3 mg/dL   Total Protein 7.3 6.5 - 8.1 g/dL   Albumin 4.2 3.5 - 5.0 g/dL   AST 24 15 - 41 U/L   ALT 26 0 - 44 U/L   Alkaline Phosphatase 74 38 - 126 U/L   Total Bilirubin 0.5 0.3 - 1.2 mg/dL   GFR calc non Af Amer >60 >60 mL/min   GFR calc Af Amer >60 >60 mL/min   Anion gap 10 5 - 15  Urinalysis, Routine w reflex microscopic  Result Value Ref Range   Color, Urine YELLOW YELLOW   APPearance CLEAR CLEAR   Specific Gravity, Urine 1.015 1.005 - 1.030   pH 8.0 5.0 - 8.0   Glucose, UA NEGATIVE NEGATIVE mg/dL   Hgb urine dipstick NEGATIVE NEGATIVE   Bilirubin Urine NEGATIVE NEGATIVE   Ketones, ur NEGATIVE NEGATIVE mg/dL   Protein, ur NEGATIVE NEGATIVE mg/dL   Nitrite NEGATIVE NEGATIVE   Leukocytes,Ua TRACE (A) NEGATIVE  Urinalysis, Microscopic (reflex)  Result Value Ref Range   RBC / HPF NONE SEEN 0 - 5 RBC/hpf   WBC, UA 0-5 0 - 5 WBC/hpf   Bacteria, UA RARE (A) NONE SEEN   Squamous Epithelial / LPF 0-5 0 - 5  POC CBG, ED  Result Value Ref Range   Glucose-Capillary 123 (H) 70 - 99 mg/dL   Comment 1 Notify RN    Comment 2 Document in Chart    No results found.  EKG EKG Interpretation  Date/Time:  Wednesday April 29 2019  22:39:21 EDT Ventricular Rate:  71 PR Interval:    QRS Duration: 86 QT Interval:  395 QTC Calculation: 430 R Axis:   36 Text Interpretation:  Sinus rhythm Confirmed by Randal Buba, Antron Seth ([id number]) on 04/29/2019 11:03:22 PM   Radiology No results found.  Procedures Procedures (including critical care time)  Medications Ordered in ED Medications  potassium chloride SA (K-DUR) 20 MEQ CR tablet (has no administration in time range)  potassium chloride SA (K-DUR) CR tablet 30 mEq (30 mEq Oral Given 04/30/19 0004)    Patient is well appearing on exam.  Vitals and labs are benign and reassuring.  Patient seems very anxious and I suspect she is worried over her formed condition.  I have reassured her that she is well and have instructed her to drink gatorade and take a vitamin.   Jenna [name] was evaluated in Emergency Department on 04/30/2019 for the symptoms described in the history of present illness. She was evaluated in the context of the global COVID-19 pandemic, which necessitated consideration that the patient might be at risk for infection with the SARS-CoV-2 virus that causes COVID-19. Institutional protocols and algorithms that pertain to the evaluation of patients at risk for COVID-19 are in a state of rapid change based on information released by regulatory bodies including the CDC and federal and state organizations. These policies and  algorithms were followed during the patient's care in the ED.   Final Clinical Impressions(s) / ED Diagnoses    Return for intractable cough, coughing up blood,fevers >100.4 unrelieved by medication, shortness of breath, intractable vomiting, chest pain, shortness of breath, weakness,numbness, changes in speech, facial asymmetry,abdominal pain, passing out,Inability to tolerate liquids or food, cough, altered mental status or any concerns. No signs of systemic illness or infection. The patient is nontoxic-appearing on exam and vital signs are  within normal limits.   I have reviewed the triage vital signs and the nursing notes. Pertinent labs &imaging results that were available during my care of the patient were reviewed by me and considered in my medical decision making (see chart for details).  After history, exam, and medical workup I feel the patient has been appropriately medically screened and is safe for discharge home. Pertinent diagnoses were discussed with the patient. Patient was given return precautions   Bertil Brickey, MD 04/30/19 0110

## 2019-05-01 DIAGNOSIS — I10 Essential (primary) hypertension: Secondary | ICD-10-CM | POA: Diagnosis not present

## 2019-05-01 DIAGNOSIS — G479 Sleep disorder, unspecified: Secondary | ICD-10-CM | POA: Diagnosis not present

## 2019-05-01 DIAGNOSIS — E878 Other disorders of electrolyte and fluid balance, not elsewhere classified: Secondary | ICD-10-CM | POA: Diagnosis not present

## 2019-05-01 DIAGNOSIS — R69 Illness, unspecified: Secondary | ICD-10-CM | POA: Diagnosis not present

## 2019-05-01 DIAGNOSIS — Z23 Encounter for immunization: Secondary | ICD-10-CM | POA: Diagnosis not present

## 2019-05-11 ENCOUNTER — Other Ambulatory Visit: Payer: Self-pay

## 2019-05-11 ENCOUNTER — Encounter (HOSPITAL_BASED_OUTPATIENT_CLINIC_OR_DEPARTMENT_OTHER): Payer: Self-pay | Admitting: Emergency Medicine

## 2019-05-11 ENCOUNTER — Emergency Department (HOSPITAL_BASED_OUTPATIENT_CLINIC_OR_DEPARTMENT_OTHER)
Admission: EM | Admit: 2019-05-11 | Discharge: 2019-05-11 | Disposition: A | Discharge status: Home / Self Care | Payer: Medicare HMO | Attending: Emergency Medicine | Admitting: Emergency Medicine

## 2019-05-11 DIAGNOSIS — Z79899 Other long term (current) drug therapy: Secondary | ICD-10-CM | POA: Insufficient documentation

## 2019-05-11 DIAGNOSIS — R69 Illness, unspecified: Secondary | ICD-10-CM | POA: Diagnosis not present

## 2019-05-11 DIAGNOSIS — I10 Essential (primary) hypertension: Secondary | ICD-10-CM | POA: Diagnosis not present

## 2019-05-11 DIAGNOSIS — F419 Anxiety disorder, unspecified: Secondary | ICD-10-CM | POA: Insufficient documentation

## 2019-05-11 LAB — BASIC METABOLIC PANEL
Anion gap: 11 (ref 5–15)
BUN: 13 mg/dL (ref 8–23)
CO2: 21 mmol/L — ABNORMAL LOW (ref 22–32)
Calcium: 8.6 mg/dL — ABNORMAL LOW (ref 8.9–10.3)
Chloride: 104 mmol/L (ref 98–111)
Creatinine, Ser: 0.48 mg/dL (ref 0.44–1.00)
GFR calc Af Amer: >60 mL/min (ref >60)
GFR calc non Af Amer: >60 mL/min (ref >60)
Glucose, Bld: 131 mg/dL — ABNORMAL HIGH (ref 70–99)
Potassium: 3.7 mmol/L (ref 3.5–5.1)
Sodium: 136 mmol/L (ref 135–145)

## 2019-05-11 LAB — CBC WITH DIFFERENTIAL/PLATELET
Abs Immature Granulocytes: 0.04 10*3/uL (ref 0.00–0.07)
Basophils Absolute: 0 10*3/uL (ref 0.0–0.1)
Basophils Relative: 0 %
Eosinophils Absolute: 0 10*3/uL (ref 0.0–0.5)
Eosinophils Relative: 1 %
HCT: 42.1 % (ref 36.0–46.0)
Hemoglobin: 14.1 g/dL (ref 12.0–15.0)
Immature Granulocytes: 1 %
Lymphocytes Relative: 19 %
Lymphs Abs: 1.6 10*3/uL (ref 0.7–4.0)
MCH: 30.7 pg (ref 26.0–34.0)
MCHC: 33.5 g/dL (ref 30.0–36.0)
MCV: 91.5 fL (ref 80.0–100.0)
Monocytes Absolute: 0.5 10*3/uL (ref 0.1–1.0)
Monocytes Relative: 6 %
Neutro Abs: 6.1 10*3/uL (ref 1.7–7.7)
Neutrophils Relative %: 73 %
Platelets: 355 10*3/uL (ref 150–400)
RBC: 4.6 MIL/uL (ref 3.87–5.11)
RDW: 12.6 % (ref 11.5–15.5)
WBC: 8.3 10*3/uL (ref 4.0–10.5)
nRBC: 0 % (ref 0.0–0.2)

## 2019-05-11 LAB — TROPONIN I (HIGH SENSITIVITY)
Troponin I (High Sensitivity): 3 ng/L (ref <18)
Troponin I (High Sensitivity): 3 ng/L (ref <18)

## 2019-05-11 MED ORDER — SODIUM CHLORIDE 0.9 % IV BOLUS
500.0000 mL | Freq: Once | INTRAVENOUS | Status: AC
  Administered 2019-05-11: 07:00:00 500 mL via INTRAVENOUS

## 2019-05-11 MED ORDER — AMLODIPINE BESYLATE 5 MG PO TABS: 10.0000 mg | ORAL_TABLET | Freq: Once | ORAL | Status: DC

## 2019-05-11 MED ORDER — LISINOPRIL 10 MG PO TABS: 10.0000 mg | ORAL_TABLET | Freq: Once | ORAL | Status: DC

## 2019-05-11 MED ORDER — ALUM & MAG HYDROXIDE-SIMETH 200-200-20 MG/5ML PO SUSP
30.0000 mL | Freq: Once | ORAL | Status: AC
  Administered 2019-05-11: 07:00:00 30 mL via ORAL
  Filled 2019-05-11: qty 30

## 2019-05-11 MED ORDER — ATENOLOL 25 MG PO TABS
50.0000 mg | ORAL_TABLET | Freq: Once | ORAL | Status: AC
  Administered 2019-05-11: 50 mg via ORAL
  Filled 2019-05-11: qty 2

## 2019-05-11 MED ORDER — HALOPERIDOL LACTATE 5 MG/ML IJ SOLN
1.0000 mg | Freq: Once | INTRAMUSCULAR | Status: AC
  Administered 2019-05-11: 1 mg via INTRAVENOUS
  Filled 2019-05-11: qty 1

## 2019-05-11 NOTE — ED Triage Notes (Addendum)
Pt. arrived c/o feeling anxious. Has had recent medication adjustments for anxiety/depression. States she has been unable to tolerate most all medications for anxiety and depression so she stopped taking the medications.  Denies any sob. C/o mild anterior chest pain that comes and goes. C/o feeling like her heart rate has been elevated. ST 110 on arrival. Pt is shaking and states she feels nervous. MD at bedside on arrival.

## 2019-05-11 NOTE — ED Notes (Signed)
ED Provider at bedside discussing test results and dispo plan of care. 

## 2019-05-11 NOTE — ED Provider Notes (Signed)
[facility] EMERGENCY DEPARTMENT Provider Note   CSN: [id number] Arrival date & time: 05/11/19  0556     History   Chief Complaint Chief Complaint  Patient presents with   Anxiety    HPI Jenna [name] is a 68 y.o. female.     The history is provided by the patient.  Anxiety This is a chronic problem. The current episode started more than 1 week ago. The problem occurs constantly. The problem has not changed since onset.Pertinent negatives include no chest pain, no abdominal pain, no headaches and no shortness of breath. Nothing aggravates the symptoms. Nothing relieves the symptoms. Treatments tried: amytriptilline and trazedone and celexa. The treatment provided no relief.  Patient with known HTN (who has not taken this am medication) and known anxiety who presents with ongoing anxiety worsened by recent admission for electrolyte abnormalities.  She is anxious and is jittery and shaking and feels as if her heart is racing.  She has stopped the elavil and trazedone started by her PMD because she did not like the way they made her feel.    Past Medical History:  Diagnosis Date   Anxiety    Hypertension    Hyponatremia     Patient Active Problem List   Diagnosis Date Noted   Hyponatremia 04/22/2019   Hypokalemia 04/22/2019   Leukocytosis 04/22/2019   Palpitations 04/22/2019   Anxiety 04/22/2019   Hypertension 04/22/2019    History reviewed. No pertinent surgical history.   OB History   No obstetric history on file.      Home Medications    Prior to Admission medications   Medication Sig Start Date End Date Taking? Authorizing Provider  acetaminophen (TYLENOL) 325 MG tablet Take 2 tablets (650 mg total) by mouth every 6 (six) hours as needed for mild pain or fever (or Fever >/= 101). 04/23/19   Roxan Hockey, MD  amLODipine (NORVASC) 10 MG tablet Take 1 tablet (10 mg total) by mouth daily. For BP 04/23/19   Emokpae, Courage, MD  atenolol  (TENORMIN) 50 MG tablet Take 1 tablet (50 mg total) by mouth 2 (two) times daily. For BP 04/23/19   Emokpae, Courage, MD  atorvastatin (LIPITOR) 20 MG tablet Take 20 mg by mouth daily.    [provider]  calcium-vitamin D (OSCAL WITH D) 500-200 MG-UNIT tablet Take 1 tablet by mouth daily.    [provider]  citalopram (CELEXA) 10 MG tablet Take 10 mg by mouth daily.  02/23/16   [provider]  lisinopril (ZESTRIL) 20 MG tablet Take 1 tablet (20 mg total) by mouth daily. For BP 04/23/19 04/22/20  Roxan Hockey, MD    Family History Family History  Problem Relation Age of Onset   Hypertension Mother     Social History Social History   Tobacco Use   Smoking status: Never Smoker   Smokeless tobacco: Never Used  Substance Use Topics   Alcohol use: Not Currently    Alcohol/week: 0.0 standard drinks   Drug use: Not Currently     Allergies   Patient has no known allergies.   Review of Systems Review of Systems  Constitutional: Negative for diaphoresis and fever.  HENT: Negative for congestion.   Eyes: Negative for visual disturbance.  Respiratory: Negative for cough and shortness of breath.   Cardiovascular: Negative for chest pain.  Gastrointestinal: Negative for abdominal pain.  Genitourinary: Negative for difficulty urinating.  Musculoskeletal: Negative for arthralgias.  Skin: Negative for rash.  Neurological: Negative  for headaches.  Psychiatric/Behavioral: The patient is nervous/anxious.   All other systems reviewed and are negative.    Physical Exam Updated Vital Signs BP (!) 180/93 (BP Location: Left Arm)    Temp 98.6 F (37 C) (Oral)    Resp (!) 22    SpO2 100%   Physical Exam Vitals signs and nursing note reviewed.  Constitutional:      General: She is not in acute distress.    Appearance: She is normal weight.  HENT:     Head: Normocephalic and atraumatic.     Nose: Nose normal.  Eyes:     Conjunctiva/sclera:  Conjunctivae normal.     Pupils: Pupils are equal, round, and reactive to light.  Neck:     Musculoskeletal: Normal range of motion and neck supple.  Cardiovascular:     Rate and Rhythm: Normal rate and regular rhythm.     Pulses: Normal pulses.     Heart sounds: Normal heart sounds.  Pulmonary:     Effort: Pulmonary effort is normal. No respiratory distress.     Breath sounds: Normal breath sounds. No wheezing or rales.  Abdominal:     General: Abdomen is flat. Bowel sounds are normal.     Tenderness: There is no abdominal tenderness. There is no guarding.  Musculoskeletal: Normal range of motion.  Skin:    General: Skin is warm and dry.     Capillary Refill: Capillary refill takes less than 2 seconds.  Neurological:     General: No focal deficit present.     Mental Status: She is alert and oriented to person, place, and time.     Deep Tendon Reflexes: Reflexes normal.  Psychiatric:        Mood and Affect: Mood is anxious.      ED Treatments / Results  Labs (all labs ordered are listed, but only abnormal results are displayed) Results for orders placed or performed during the hospital encounter of 05/11/19  CBC with Differential/Platelet  Result Value Ref Range   WBC 8.3 4.0 - 10.5 K/uL   RBC 4.60 3.87 - 5.11 MIL/uL   Hemoglobin 14.1 12.0 - 15.0 g/dL   HCT 42.1 36.0 - 46.0 %   MCV 91.5 80.0 - 100.0 fL   MCH 30.7 26.0 - 34.0 pg   MCHC 33.5 30.0 - 36.0 g/dL   RDW 12.6 11.5 - 15.5 %   Platelets 355 150 - 400 K/uL   nRBC 0.0 0.0 - 0.2 %   Neutrophils Relative % 73 %   Neutro Abs 6.1 1.7 - 7.7 K/uL   Lymphocytes Relative 19 %   Lymphs Abs 1.6 0.7 - 4.0 K/uL   Monocytes Relative 6 %   Monocytes Absolute 0.5 0.1 - 1.0 K/uL   Eosinophils Relative 1 %   Eosinophils Absolute 0.0 0.0 - 0.5 K/uL   Basophils Relative 0 %   Basophils Absolute 0.0 0.0 - 0.1 K/uL   Immature Granulocytes 1 %   Abs Immature Granulocytes 0.04 0.00 - 0.07 K/uL   No results found.  EKG  Date:  05/11/2019  Rate: 97  Rhythm: normal sinus rhythm  QRS Axis: normal  Intervals: normal  ST/T Wave abnormalities: normal  Conduction Disutrbances: none  Narrative Interpretation: unremarkable     Radiology No results found.  Procedures Procedures (including critical care time)  Medications Ordered in ED Medications  atenolol (TENORMIN) tablet 50 mg (has no administration in time range)  alum & mag hydroxide-simeth (MAALOX/MYLANTA) 200-200-20 MG/5ML suspension  30 mL (has no administration in time range)  haloperidol lactate (HALDOL) injection 1 mg (has no administration in time range)  sodium chloride 0.9 % bolus 500 mL (500 mLs Intravenous New Bag/Given 05/11/19 [id number])     Patient needs to follow up with her PMD and a therapist to find a treatment plan that works for her anxiety.  We have treated her blood pressure with her home medication and I have given her a bolus.  I do not believe the ED is the correct place to start anti-anxiety medications as she has had symptoms from and stopped most of the medications prescribed by her PMD.  She is not SI or HI and she does not meet inpatient psychiatric criteria.    Jenna [name] was evaluated in Emergency Department on 05/11/2019 for the symptoms described in the history of present illness. She was evaluated in the context of the global COVID-19 pandemic, which necessitated consideration that the patient might be at risk for infection with the SARS-CoV-2 virus that causes COVID-19. Institutional protocols and algorithms that pertain to the evaluation of patients at risk for COVID-19 are in a state of rapid change based on information released by regulatory bodies including the CDC and federal and state organizations. These policies and algorithms were followed during the patient's care in the ED.   Final Clinical Impressions(s) / ED Diagnoses   Signed out to Dr. Alvino Chapel pending results of labs.     Kaine Mcquillen, MD 05/11/19 [phone]

## 2019-05-11 NOTE — ED Notes (Signed)
Pt is sinus on monitor. HR is 90.

## 2019-05-11 NOTE — ED Provider Notes (Signed)
  Physical Exam  BP (!) 149/99   Pulse 88   Temp 98.6 F (37 C) (Oral)   Resp 16   SpO2 100%   Physical Exam  ED Course/Procedures     Procedures  MDM  Received patient in signout.  Troponin negative x2.  Discharge       Davonna Belling, MD 05/11/19 0930

## 2019-05-19 DIAGNOSIS — R69 Illness, unspecified: Secondary | ICD-10-CM | POA: Diagnosis not present

## 2019-06-26 DIAGNOSIS — B370 Candidal stomatitis: Secondary | ICD-10-CM | POA: Diagnosis not present

## 2019-06-26 DIAGNOSIS — R69 Illness, unspecified: Secondary | ICD-10-CM | POA: Diagnosis not present

## 2019-07-27 DIAGNOSIS — Z0100 Encounter for examination of eyes and vision without abnormal findings: Secondary | ICD-10-CM | POA: Diagnosis not present

## 2019-07-27 DIAGNOSIS — H52223 Regular astigmatism, bilateral: Secondary | ICD-10-CM | POA: Diagnosis not present

## 2019-07-27 DIAGNOSIS — H524 Presbyopia: Secondary | ICD-10-CM | POA: Diagnosis not present

## 2019-10-05 DIAGNOSIS — I10 Essential (primary) hypertension: Secondary | ICD-10-CM | POA: Diagnosis not present

## 2019-10-05 DIAGNOSIS — E7800 Pure hypercholesterolemia, unspecified: Secondary | ICD-10-CM | POA: Diagnosis not present

## 2019-10-05 DIAGNOSIS — R69 Illness, unspecified: Secondary | ICD-10-CM | POA: Diagnosis not present

## 2019-10-05 DIAGNOSIS — M810 Age-related osteoporosis without current pathological fracture: Secondary | ICD-10-CM | POA: Diagnosis not present

## 2019-10-06 DIAGNOSIS — E7800 Pure hypercholesterolemia, unspecified: Secondary | ICD-10-CM | POA: Diagnosis not present

## 2019-10-06 DIAGNOSIS — I10 Essential (primary) hypertension: Secondary | ICD-10-CM | POA: Diagnosis not present

## 2019-10-16 ENCOUNTER — Ambulatory Visit: Payer: Medicare HMO | Attending: Internal Medicine

## 2019-10-28 DIAGNOSIS — R69 Illness, unspecified: Secondary | ICD-10-CM | POA: Diagnosis not present

## 2019-11-03 DIAGNOSIS — R69 Illness, unspecified: Secondary | ICD-10-CM | POA: Diagnosis not present

## 2019-11-17 DIAGNOSIS — R69 Illness, unspecified: Secondary | ICD-10-CM | POA: Diagnosis not present

## 2019-11-26 DIAGNOSIS — R69 Illness, unspecified: Secondary | ICD-10-CM | POA: Diagnosis not present

## 2019-12-21 DIAGNOSIS — R634 Abnormal weight loss: Secondary | ICD-10-CM | POA: Diagnosis not present

## 2019-12-21 DIAGNOSIS — I10 Essential (primary) hypertension: Secondary | ICD-10-CM | POA: Diagnosis not present

## 2019-12-21 DIAGNOSIS — R69 Illness, unspecified: Secondary | ICD-10-CM | POA: Diagnosis not present

## 2019-12-21 DIAGNOSIS — K921 Melena: Secondary | ICD-10-CM | POA: Diagnosis not present

## 2019-12-21 DIAGNOSIS — E46 Unspecified protein-calorie malnutrition: Secondary | ICD-10-CM | POA: Diagnosis not present

## 2020-01-18 DIAGNOSIS — R69 Illness, unspecified: Secondary | ICD-10-CM | POA: Diagnosis not present

## 2020-01-18 DIAGNOSIS — B370 Candidal stomatitis: Secondary | ICD-10-CM | POA: Diagnosis not present

## 2020-01-18 DIAGNOSIS — R300 Dysuria: Secondary | ICD-10-CM | POA: Diagnosis not present

## 2020-01-19 DIAGNOSIS — F419 Anxiety disorder, unspecified: Secondary | ICD-10-CM | POA: Diagnosis not present

## 2020-01-19 DIAGNOSIS — R69 Illness, unspecified: Secondary | ICD-10-CM | POA: Diagnosis not present

## 2020-01-21 DIAGNOSIS — R69 Illness, unspecified: Secondary | ICD-10-CM | POA: Diagnosis not present

## 2020-01-21 DIAGNOSIS — E559 Vitamin D deficiency, unspecified: Secondary | ICD-10-CM | POA: Diagnosis not present

## 2020-01-21 DIAGNOSIS — Z79899 Other long term (current) drug therapy: Secondary | ICD-10-CM | POA: Diagnosis not present

## 2020-01-22 DIAGNOSIS — H0288B Meibomian gland dysfunction left eye, upper and lower eyelids: Secondary | ICD-10-CM | POA: Diagnosis not present

## 2020-01-29 DIAGNOSIS — Z85828 Personal history of other malignant neoplasm of skin: Secondary | ICD-10-CM | POA: Diagnosis not present

## 2020-01-29 DIAGNOSIS — L578 Other skin changes due to chronic exposure to nonionizing radiation: Secondary | ICD-10-CM | POA: Diagnosis not present

## 2020-01-29 DIAGNOSIS — Z808 Family history of malignant neoplasm of other organs or systems: Secondary | ICD-10-CM | POA: Diagnosis not present

## 2020-01-29 DIAGNOSIS — L814 Other melanin hyperpigmentation: Secondary | ICD-10-CM | POA: Diagnosis not present

## 2020-01-29 DIAGNOSIS — L821 Other seborrheic keratosis: Secondary | ICD-10-CM | POA: Diagnosis not present

## 2020-01-29 DIAGNOSIS — C44712 Basal cell carcinoma of skin of right lower limb, including hip: Secondary | ICD-10-CM | POA: Diagnosis not present

## 2020-01-29 DIAGNOSIS — D485 Neoplasm of uncertain behavior of skin: Secondary | ICD-10-CM | POA: Diagnosis not present

## 2020-02-02 DIAGNOSIS — Z86010 Personal history of colonic polyps: Secondary | ICD-10-CM | POA: Diagnosis not present

## 2020-02-29 DIAGNOSIS — Z1231 Encounter for screening mammogram for malignant neoplasm of breast: Secondary | ICD-10-CM | POA: Diagnosis not present

## 2020-03-01 DIAGNOSIS — C44712 Basal cell carcinoma of skin of right lower limb, including hip: Secondary | ICD-10-CM | POA: Diagnosis not present

## 2020-03-01 DIAGNOSIS — F419 Anxiety disorder, unspecified: Secondary | ICD-10-CM | POA: Diagnosis not present

## 2020-03-01 DIAGNOSIS — L570 Actinic keratosis: Secondary | ICD-10-CM | POA: Diagnosis not present

## 2020-03-01 DIAGNOSIS — R69 Illness, unspecified: Secondary | ICD-10-CM | POA: Diagnosis not present

## 2020-03-01 DIAGNOSIS — L988 Other specified disorders of the skin and subcutaneous tissue: Secondary | ICD-10-CM | POA: Diagnosis not present

## 2020-03-03 DIAGNOSIS — R69 Illness, unspecified: Secondary | ICD-10-CM | POA: Diagnosis not present

## 2020-03-09 DIAGNOSIS — Z1159 Encounter for screening for other viral diseases: Secondary | ICD-10-CM | POA: Diagnosis not present

## 2020-03-14 DIAGNOSIS — Z86010 Personal history of colonic polyps: Secondary | ICD-10-CM | POA: Diagnosis not present

## 2020-03-14 DIAGNOSIS — D128 Benign neoplasm of rectum: Secondary | ICD-10-CM | POA: Diagnosis not present

## 2020-03-17 DIAGNOSIS — D128 Benign neoplasm of rectum: Secondary | ICD-10-CM | POA: Diagnosis not present

## 2020-03-31 DIAGNOSIS — R69 Illness, unspecified: Secondary | ICD-10-CM | POA: Diagnosis not present

## 2020-03-31 DIAGNOSIS — F419 Anxiety disorder, unspecified: Secondary | ICD-10-CM | POA: Diagnosis not present

## 2020-04-06 DIAGNOSIS — R69 Illness, unspecified: Secondary | ICD-10-CM | POA: Diagnosis not present

## 2020-04-06 DIAGNOSIS — M810 Age-related osteoporosis without current pathological fracture: Secondary | ICD-10-CM | POA: Diagnosis not present

## 2020-04-06 DIAGNOSIS — Z0000 Encounter for general adult medical examination without abnormal findings: Secondary | ICD-10-CM | POA: Diagnosis not present

## 2020-04-06 DIAGNOSIS — E7800 Pure hypercholesterolemia, unspecified: Secondary | ICD-10-CM | POA: Diagnosis not present

## 2020-04-06 DIAGNOSIS — I10 Essential (primary) hypertension: Secondary | ICD-10-CM | POA: Diagnosis not present

## 2020-04-06 DIAGNOSIS — E46 Unspecified protein-calorie malnutrition: Secondary | ICD-10-CM | POA: Diagnosis not present

## 2020-04-28 DIAGNOSIS — R69 Illness, unspecified: Secondary | ICD-10-CM | POA: Diagnosis not present

## 2020-05-02 DIAGNOSIS — R69 Illness, unspecified: Secondary | ICD-10-CM | POA: Diagnosis not present

## 2020-05-02 DIAGNOSIS — F419 Anxiety disorder, unspecified: Secondary | ICD-10-CM | POA: Diagnosis not present

## 2020-05-09 DIAGNOSIS — R69 Illness, unspecified: Secondary | ICD-10-CM | POA: Diagnosis not present

## 2020-05-10 DIAGNOSIS — R69 Illness, unspecified: Secondary | ICD-10-CM | POA: Diagnosis not present

## 2020-05-24 DIAGNOSIS — R69 Illness, unspecified: Secondary | ICD-10-CM | POA: Diagnosis not present

## 2020-05-25 DIAGNOSIS — R300 Dysuria: Secondary | ICD-10-CM | POA: Diagnosis not present

## 2020-05-25 DIAGNOSIS — R509 Fever, unspecified: Secondary | ICD-10-CM | POA: Diagnosis not present

## 2020-06-02 DIAGNOSIS — R69 Illness, unspecified: Secondary | ICD-10-CM | POA: Diagnosis not present

## 2020-06-02 DIAGNOSIS — F419 Anxiety disorder, unspecified: Secondary | ICD-10-CM | POA: Diagnosis not present

## 2020-06-28 DIAGNOSIS — R69 Illness, unspecified: Secondary | ICD-10-CM | POA: Diagnosis not present

## 2020-06-28 DIAGNOSIS — F419 Anxiety disorder, unspecified: Secondary | ICD-10-CM | POA: Diagnosis not present

## 2020-08-01 DIAGNOSIS — F419 Anxiety disorder, unspecified: Secondary | ICD-10-CM | POA: Diagnosis not present

## 2020-08-01 DIAGNOSIS — R69 Illness, unspecified: Secondary | ICD-10-CM | POA: Diagnosis not present

## 2020-08-08 DIAGNOSIS — H52223 Regular astigmatism, bilateral: Secondary | ICD-10-CM | POA: Diagnosis not present

## 2020-08-08 DIAGNOSIS — H524 Presbyopia: Secondary | ICD-10-CM | POA: Diagnosis not present

## 2020-08-31 DIAGNOSIS — R69 Illness, unspecified: Secondary | ICD-10-CM | POA: Diagnosis not present

## 2020-08-31 DIAGNOSIS — F419 Anxiety disorder, unspecified: Secondary | ICD-10-CM | POA: Diagnosis not present

## 2020-10-10 DIAGNOSIS — E46 Unspecified protein-calorie malnutrition: Secondary | ICD-10-CM | POA: Diagnosis not present

## 2020-10-10 DIAGNOSIS — I10 Essential (primary) hypertension: Secondary | ICD-10-CM | POA: Diagnosis not present

## 2020-10-10 DIAGNOSIS — M810 Age-related osteoporosis without current pathological fracture: Secondary | ICD-10-CM | POA: Diagnosis not present

## 2020-10-10 DIAGNOSIS — E7800 Pure hypercholesterolemia, unspecified: Secondary | ICD-10-CM | POA: Diagnosis not present

## 2020-10-10 DIAGNOSIS — R413 Other amnesia: Secondary | ICD-10-CM | POA: Diagnosis not present

## 2020-10-10 DIAGNOSIS — R69 Illness, unspecified: Secondary | ICD-10-CM | POA: Diagnosis not present

## 2020-10-11 ENCOUNTER — Other Ambulatory Visit: Payer: Self-pay

## 2020-10-11 ENCOUNTER — Emergency Department (HOSPITAL_BASED_OUTPATIENT_CLINIC_OR_DEPARTMENT_OTHER)
Admission: EM | Admit: 2020-10-11 | Discharge: 2020-10-11 | Disposition: A | Discharge status: Home / Self Care | Payer: Medicare HMO | Attending: Emergency Medicine | Admitting: Emergency Medicine

## 2020-10-11 DIAGNOSIS — I10 Essential (primary) hypertension: Secondary | ICD-10-CM | POA: Diagnosis not present

## 2020-10-11 DIAGNOSIS — Z79899 Other long term (current) drug therapy: Secondary | ICD-10-CM | POA: Insufficient documentation

## 2020-10-11 DIAGNOSIS — Z046 Encounter for general psychiatric examination, requested by authority: Secondary | ICD-10-CM | POA: Diagnosis not present

## 2020-10-11 DIAGNOSIS — R454 Irritability and anger: Secondary | ICD-10-CM

## 2020-10-11 DIAGNOSIS — R69 Illness, unspecified: Secondary | ICD-10-CM | POA: Diagnosis not present

## 2020-10-11 NOTE — Discharge Instructions (Signed)
Get help right away if: You may be a danger to yourself or others.

## 2020-10-11 NOTE — ED Triage Notes (Signed)
Pt. Husband brought the Pt. Saying she needs an evaluation.  The Pt. Is saying she is not suicidal or homicidal and has no issues at the present time.  The reason she has come is due to her husband forcing her to come today.  Pt is calm and collected with reports of seeing her regular Dr. Wilburn Mylar.  Pt. Had blood draw yesterday and assessment done of how she was feeling.  Pt. Said her Dr. Has a plan with Neuro due to Memory issues.  Pt. Sees Psych at end of month per Pt.  Pt. Does report depression and takes meds for this.  Pt. Reports not eating well because she worries about every thing.  Pt. Is in no distress in triage.

## 2020-10-11 NOTE — ED Provider Notes (Signed)
[place] EMERGENCY DEPARTMENT Provider Note   CSN: [id number] Arrival date & time: 10/11/20  1539     History Chief Complaint  Patient presents with  . Psychiatric Evaluation    Jenna [name] is a 70 y.o. female BIB her husband with complaint of anger outburst.  History is gathered by both the patient and the husband by telephone and at bedside.  Patient has a 2-year history of depression.  He is currently being treated with both BuSpar and Pristiq by her primary care physician.  Her husband reports that she has had weight loss, decreased appetite, and frequently stays in bed if there is nothing that she has to do outside of the house.  Today was one of those days and she was not getting out of bed.  He decided go to the grocery store.  He states that when he returned she became enraged and started screaming at him throwing her arms around asking him why he bought certain items at the grocery store.  He called her psychiatrist office and they stated that there was nothing they could do and that he should either bring her to the emergency department or call 911.  Patient reports that she has had about 2 years of depression and has felt isolated since the Covid pandemic.  She does acknowledge that she has had some relationship issues and often feels irritated by her husband.  She states that she realizes that her outburst today was irrational and not based on what he bought at the grocery store but being irritated.  She is alert and oriented to person place and time.  She denies feeling suicidal or homicidal.  She did not use any drugs or alcohol.  She has no history of psychosis and denies audio or visual hallucinations.  Patient had blood work drawn yesterday at her PCPs office that was all within normal limits  HPI     Past Medical History:  Diagnosis Date  . Anxiety   . Hypertension   . Hyponatremia     Patient Active Problem List   Diagnosis Date Noted  . Hyponatremia  04/22/2019  . Hypokalemia 04/22/2019  . Leukocytosis 04/22/2019  . Palpitations 04/22/2019  . Anxiety 04/22/2019  . Hypertension 04/22/2019    No past surgical history on file.   OB History   No obstetric history on file.     Family History  Problem Relation Age of Onset  . Hypertension Mother     Social History   Tobacco Use  . Smoking status: Never Smoker  . Smokeless tobacco: Never Used  Substance Use Topics  . Alcohol use: Not Currently    Alcohol/week: 0.0 standard drinks  . Drug use: Not Currently    Home Medications Prior to Admission medications   Medication Sig Start Date End Date Taking? Authorizing Provider  acetaminophen (TYLENOL) 325 MG tablet Take 2 tablets (650 mg total) by mouth every 6 (six) hours as needed for mild pain or fever (or Fever >/= 101). 04/23/19   Roxan Hockey, MD  amLODipine (NORVASC) 10 MG tablet Take 1 tablet (10 mg total) by mouth daily. For BP 04/23/19   Emokpae, Courage, MD  atenolol (TENORMIN) 50 MG tablet Take 1 tablet (50 mg total) by mouth 2 (two) times daily. For BP 04/23/19   Emokpae, Courage, MD  atorvastatin (LIPITOR) 20 MG tablet Take 20 mg by mouth daily.    [provider]  calcium-vitamin D (OSCAL WITH D) 500-200 MG-UNIT tablet Take  1 tablet by mouth daily.    [provider]  lisinopril (ZESTRIL) 20 MG tablet Take 1 tablet (20 mg total) by mouth daily. For BP 04/23/19 04/22/20  Roxan Hockey, MD    Allergies    Patient has no known allergies.  Review of Systems   Review of Systems Ten systems reviewed and are negative for acute change, except as noted in the HPI.   Physical Exam Updated Vital Signs BP (!) 171/87 (BP Location: Right Arm)   Pulse 64   Temp 97.7 F (36.5 C) (Oral)   Resp 18   Ht 4\' 10"  (1.473 m)   Wt 40.8 kg   SpO2 100%   BMI 18.81 kg/m   Physical Exam Vitals and nursing note reviewed.  Constitutional:      General: She is not in acute distress.    Appearance: She is  well-developed and well-nourished. She is not diaphoretic.  HENT:     Head: Normocephalic and atraumatic.  Eyes:     General: No scleral icterus.    Conjunctiva/sclera: Conjunctivae normal.  Cardiovascular:     Rate and Rhythm: Normal rate and regular rhythm.     Heart sounds: Normal heart sounds. No murmur heard. No friction rub. No gallop.   Pulmonary:     Effort: Pulmonary effort is normal. No respiratory distress.     Breath sounds: Normal breath sounds.  Abdominal:     General: Bowel sounds are normal. There is no distension.     Palpations: Abdomen is soft. There is no mass.     Tenderness: There is no abdominal tenderness. There is no guarding.  Musculoskeletal:     Cervical back: Normal range of motion.  Skin:    General: Skin is warm and dry.  Neurological:     Mental Status: She is alert and oriented to person, place, and time.  Psychiatric:        Attention and Perception: Attention normal. She does not perceive auditory or visual hallucinations.        Mood and Affect: Mood is not anxious. Affect is flat. Affect is not labile, angry or tearful.        Speech: Speech is not rapid and pressured, delayed, slurred or tangential.        Behavior: Behavior normal. Behavior is not agitated, slowed, aggressive, withdrawn or combative. Behavior is cooperative.        Thought Content: Thought content normal. Thought content is not paranoid or delusional. Thought content does not include homicidal or suicidal ideation.        Cognition and Memory: Cognition and memory normal.        Judgment: Judgment normal.     ED Results / Procedures / Treatments   Labs (all labs ordered are listed, but only abnormal results are displayed) Labs Reviewed - No data to display  EKG None  Radiology No results found.  Procedures Procedures   Medications Ordered in ED Medications - No data to display  ED Course  I have reviewed the triage vital signs and the nursing  notes.  Pertinent labs & imaging results that were available during my care of the patient were reviewed by me and considered in my medical decision making (see chart for details).    MDM Rules/Calculators/A&P                          Patient here with anger and agitation earlier today.  She is  calm and rational and realizes that she was just irritated and angry earlier.  I discussed this with her husband.  He states that he came because he was directed to do so by the patient psychiatric office.  She does not appear to have any emergent psychiatric condition including suicidal homicidal or psychotic behavior.  Patient agrees to follow closely with her primary care physician and appears appropriate for discharge at this time. Final Clinical Impression(s) / ED Diagnoses Final diagnoses:  None    Rx / DC Orders ED Discharge Orders    None       Margarita Mail, PA-C 10/11/20 1747    Lajean Saver, MD 10/11/20 2003

## 2020-10-12 ENCOUNTER — Encounter: Payer: Self-pay | Admitting: Counselor

## 2020-10-27 DIAGNOSIS — F419 Anxiety disorder, unspecified: Secondary | ICD-10-CM | POA: Diagnosis not present

## 2020-10-27 DIAGNOSIS — R69 Illness, unspecified: Secondary | ICD-10-CM | POA: Diagnosis not present

## 2020-11-24 DIAGNOSIS — R69 Illness, unspecified: Secondary | ICD-10-CM | POA: Diagnosis not present

## 2020-11-24 DIAGNOSIS — F419 Anxiety disorder, unspecified: Secondary | ICD-10-CM | POA: Diagnosis not present

## 2020-11-28 ENCOUNTER — Other Ambulatory Visit: Payer: Self-pay

## 2020-11-28 ENCOUNTER — Ambulatory Visit (INDEPENDENT_AMBULATORY_CARE_PROVIDER_SITE_OTHER): Payer: Medicare HMO | Admitting: Counselor

## 2020-11-28 ENCOUNTER — Ambulatory Visit: Payer: Medicare HMO

## 2020-11-28 ENCOUNTER — Encounter: Payer: Self-pay | Admitting: Counselor

## 2020-11-28 DIAGNOSIS — R454 Irritability and anger: Secondary | ICD-10-CM

## 2020-11-28 DIAGNOSIS — G3184 Mild cognitive impairment, so stated: Secondary | ICD-10-CM

## 2020-11-28 DIAGNOSIS — F32A Depression, unspecified: Secondary | ICD-10-CM

## 2020-11-28 DIAGNOSIS — F329 Major depressive disorder, single episode, unspecified: Secondary | ICD-10-CM

## 2020-11-28 DIAGNOSIS — F09 Unspecified mental disorder due to known physiological condition: Secondary | ICD-10-CM

## 2020-11-28 NOTE — Progress Notes (Signed)
   Psychometrist Note   Cognitive testing was administered to [employer] by Lamar Benes, B.S. (Technician) under the supervision of Alphonzo Severance, Psy.D., ABN. Jenna [name] was able to tolerate all test procedures. Dr. Nicole Kindred met with the patient as needed to manage any emotional reactions to the testing procedures. Rest breaks were offered.    The battery of tests administered was selected by Dr. Nicole Kindred with consideration to the patient's current level of functioning, the nature of her symptoms, emotional and behavioral responses during the interview, level of literacy, observed level of motivation/effort, and the nature of the referral question. This battery was communicated to the psychometrist. Communication between Dr. Nicole Kindred and the psychometrist was ongoing throughout the evaluation and Dr. Nicole Kindred was immediately accessible at all times. Dr. Nicole Kindred provided supervision to the technician on the date of this service, to the extent necessary to assure the quality of all services provided.    Jenna [name] will return in approximately one week for an interactive feedback session with Dr. Nicole Kindred, at which time test performance, clinical impressions, and treatment recommendations will be reviewed in detail. The patient understands she can contact our office should she require our assistance before this time.   A total of 115 minutes of billable time were spent with Jenna [name] by the technician, including test administration and scoring time. Billing for these services is reflected in Dr. Les Pou note.   This note reflects time spent with the psychometrician and does not include test scores, clinical history, or any interpretations made by Dr. Nicole Kindred. The full report will follow in a separate note.

## 2020-11-28 NOTE — Progress Notes (Signed)
Jenna [name] [name]  [name] [name] MRN: [id number] Date of Birth: Oct 20, 1950 Age: 70 y.o. Education: 14 years  Referral Circumstances and Background Information  Jenna [name] [name] is a 70 y.o., right-hand dominant, married woman with a history of HTN, anxiety, depression, and anger outbursts referred by Jenna [name] [name] PCP, Jenna [name] Odor, PA with [facility]. There is not much information available regarding Jenna [name] specific circumstances of Jenna [name] referral or relevant history but I see that Jenna [name] [name] presented to Jenna [name] ED on Jenna [name] date of Jenna [name] referral after being instructed to go there by Jenna [name] [name] psychiatric provider (follows at [facility]) after Jenna [name] [name] husband called complaining of anger outbursts. Jenna [name] [name] had become irate at Jenna [name] [name] husband after he returned from Jenna [name] grocery store, but it seems that Jenna [name] [name] realized Jenna [name] [name] was being unreasonable and was calm and rational when eventually evaluated in [name] ED. Notes also mention a history of significant depression, not getting out of bed much unless Jenna [name] [name] has things Jenna [name] [name] needs to do outside Jenna [name] house.   On interview, Jenna [name] [name] and Jenna [name] [name] husband reported that Jenna [name] [name] mother died of Alzheimer's disease 5 years ago. Jenna [name] [name] has commented that Jenna [name] [name] feels like Jenna [name] [name] is getting it, and Jenna [name] evaluation was reportedly ordered to investigate that. As above, there are also some behavior changes. Jenna [name] [name] herself is Jenna [name] one most concerned about Jenna [name] [name] memory. Jenna [name] [name] feels as though this started 1.5 years ago, with taking longer to recall things and Jenna [name] [name] left Jenna [name] [name] phone at work a few times. Those were really Jenna [name] only changes noted. Jenna [name] [name] husband, on Jenna [name] other hand is more concerned about behavior changes, which also started 1.5 years ago. That involves being "very negative" Jenna [name] [name] "never has anything positive to say" and Jenna [name] [name] is "nitpicking little things all Jenna [name] time." This has caused some difficulties within their relationship. This often takes Jenna [name] form of  irritability, as per Jenna [name] [name] husband. Jenna [name] [name] also doesn't want to do things as much as Jenna [name] [name] did in Jenna [name] past. Jenna [name] [name] will spend a large amount of time in Jenna [name] bedroom, which Jenna [name] [name] never did before. Jenna [name] [name] has stopped walking and doing some of Jenna [name] things that Jenna [name] [name] used to do. Jenna [name] [name] said Jenna [name] [name] is aware of that, Jenna [name] [name] simply doesn't feel like doing some of Jenna [name] things that Jenna [name] [name] used to. Jenna [name] [name] and Jenna [name] [name] husband denied that Jenna [name] [name] has any frank inappropriate social behaviors, compulsive behaviors, repetitive behaviors, collecting, or Jenna [name] like. Jenna [name] [name] is a bit compulsive in terms of cleanliness although Jenna [name] [name] has always been that way. Jenna [name] [name] presented as having good insight into Jenna [name] changes described by Jenna [name] [name] husband and to be reasonable when discussing them.   With respect to mood, Jenna [name] [name] admits that Jenna [name] [name] is much less happy than Jenna [name] [name] used to be. Jenna [name] [name] was not entirely clear as to whether Jenna [name] [name] feels depressed and seemed to have some alexithymia but did appear near tears during discussion.Jenna [name] [name] stated that Jenna [name] [name] often just feels numb. Jenna [name] [name] is not as energetic as Jenna [name] [name] used to be. Jenna [name] [name] does not feel as though Jenna [name] [name] gets much pleasure out of things these days, Jenna [name] [name] is going through Jenna [name] motions of life. Jenna [name] [name] has little motivation. Jenna [name] [name] acknowledged "somewhat" having feelings of helplessness, hopelessness, and worthlessness and stated that Jenna [name] [name] does get irritated with herself. Jenna [name] [name] appetite is less than it used to be and Jenna [name] [name] estimated Jenna [name] [name] has lost about 15 lbs over Jenna [name] past two years. Jenna [name] [name] reported that Jenna [name] [name] sleep is "fair" Jenna [name] [name] does wake up every few hours but  can go back to sleep. Jenna [name] [name] thinks Jenna [name] [name] is getting around 6 hours of sleep per night on average. Jenna [name] [name] has screamed in Jenna [name] [name] sleep before, although it is associated with bad dreams and Jenna [name] [name] has apparently done that for years and it has not increased. Jenna [name] [name] reported that Jenna [name] [name] does have some anxiety and worry, Jenna [name] [name] worries about Jenna [name] [name] father who is in his 68s. He is in independent living but Jenna [name] [name] wonders if he should be in a  higher level of care. It sounds as though there may be a sense of some regret, because Jenna [name] pandemic occurred right around Jenna [name] time they retired, and seems to have gotten in Jenna [name] way of their plans. They wanted to travel and do other things and have been staying closer to home than they would like.   With respect to functioning, it sounds like Jenna [name] [name] does not have anything that Jenna [name] [name] isn't able to do that Jenna [name] [name] used to do, although Jenna [name] [name] thinks Jenna [name] [name] is less efficient. They largely denied any difficulties keeping track of medications, with driving, with cooking, with managing money or with any other functional activities. Jenna [name] [name] is still doing everything Jenna [name] [name] used to do but has a subjective sense that it is not as efficient or easy as it once was.     Past Medical History and Review of Relevant Studies   [name] Active Problem List   Diagnosis Date Noted  . Hyponatremia 04/22/2019  . Hypokalemia 04/22/2019  . Leukocytosis 04/22/2019  . Palpitations 04/22/2019  . Anxiety 04/22/2019  . Hypertension 04/22/2019   Review of Neuroimaging and Relevant Medical History: Jenna [name] [name] stated that Jenna [name] [name] had neuroimaging of Jenna [name] [name] brain "many many" years ago related to dizziness, but it was more than 30 years ago. Jenna [name] [name] denied any history of significant head injuries, strokes, seizures, neurological surgeries, or other neurologically pertinent medical history.   Current Outpatient Medications  Medication Sig Dispense Refill  . acetaminophen (TYLENOL) 325 MG tablet Take 2 tablets (650 mg total) by mouth every 6 (six) hours as needed for mild pain or fever (or Fever >/= 101). 12 tablet 0  . amLODipine (NORVASC) 10 MG tablet Take 1 tablet (10 mg total) by mouth daily. For BP 30 tablet 3  . atenolol (TENORMIN) 50 MG tablet Take 1 tablet (50 mg total) by mouth 2 (two) times daily. For BP 60 tablet 5  . atorvastatin (LIPITOR) 20 MG tablet Take 20 mg by mouth daily.    . calcium-vitamin D (OSCAL WITH D) 500-200 MG-UNIT  tablet Take 1 tablet by mouth daily.    Marland Kitchen lisinopril (ZESTRIL) 20 MG tablet Take 1 tablet (20 mg total) by mouth daily. For BP 30 tablet 11   No current facility-administered medications for this visit.    Family History  Problem Relation Age of Onset  . Hypertension Mother    There is a family history of dementia. Jenna [name] [name] mother developed Jenna [name] condition, they think Jenna [name] [name] started with changes in Jenna [name] [name] late 67s or early 75s. Jenna [name] [name] was in a nursing facility eventually, and passed 5 years ago. They denied any other family history of dementia. There is no  family history of psychiatric illness.  Psychosocial History  Developmental, Educational and Employment History: Jenna [name] [name] grew up in [place], and Jenna [name] [name] denied any history of abuse or neglect. Jenna [name] [name] reported that Jenna [name] [name] parents were very strict and Jenna [name] [name] husband commented that has come up a lot over Jenna [name] years and thinks it was difficult for Jenna [name] [name]. Jenna [name] [name] reported that Jenna [name] [name] was  an "average" student who did well and didn't have any learning problems. Jenna [name] [name] was never particularly strong in math. 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 Jenna [name] [name] went back to school in 2004 to study [employer] although Jenna [name] [name] has mainly worked in other industries. Jenna [name] [name] earned that degree from [employer] 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    Psychiatric History: Jenna [name] [name] has been on antidepressants periodically throughout Jenna [name] [name] adult life. More recently, Jenna [name] [name] has been seeing Maxcine Ham at [facility] for about a year. Jenna [name] [name] is taking desvenlefaxine and buspar. Jenna [name] [name] reported medications have been somewhat helpful but in Jenna [name] past when Jenna [name] [name] has taken antidepressants, they had greater efficiacy. Jenna [name] [name] has tried some counseling and was involved for 1.5 years, with Burnetta Sabin at [facility] who recommended that she do couples counseling with Jenna [name] [name]  husband but they didn't follow through. It sounds like Jenna [name] [name] husband felt Jenna [name] problem was more related to Jenna [name] [name] and didn't want to start couples until Jenna [name] [name] improved.   Substance Use History: Jenna [name] [name] drinks occasionally, a few glasses of wine a week, Jenna [name] [name] does not use nicotine or drugs.   Relationship History and Living Cimcumstances: Jenna [name] [name] and Jenna [name] [name] husband have been married for nearly 25 years. Jenna [name] [name] was married once before, for around 11 years. Jenna [name] [name] has a daughter from Jenna [name] [name] previous marriage. It sounds as though Jenna [name] [name] daughter thought Jenna [name] [name] was "very depressed" but has not noticed much in Jenna [name] way of memory problems. Jenna [name] [name]'s husband eventually talked with Jenna [name] [name] about their issues, because he needed help.   Mental Status and Behavioral Observations  Sensorium/Arousal: Jenna [name] [name]'s level of arousal was awake and alert. Hearing and vision were adequate for testing purposes. Orientation: Jenna [name] [name] was alert and fully oriented to person, place, time, and situation.  Appearance: Dressed in appropriate, casual clothing with reasonable grooming and hygiene.  Behavior: Pleasant, appropriate, seemed to have good insight, was reasonable with husband.  Speech/language: Speech was normal in rate, rhythm, volume, and prosody.  Gait/Posture: Gait was not formally examined, had no difficulties ambulating between rooms during Jenna [name] evaluation.  Movement: No overt signs/symptoms of movement disorder noted Social Comportment: Pleasant and appropriate Mood: "Blah" Affect: Dysphoric with near tearfulness at times Thought process/content: Thought process was logical and goal oriented, though content was appropriate to Jenna [name] topics discussed. [name] presented as a fairly reliable historian.  Safety: No thoughts of harming self or others on direct questioning.  Insight: [facility]ide Cognitive Assessment  11/28/2020  Visuospatial/ Executive (0/5) 5  Naming (0/3) 3  Attention: Read list of digits (0/2) 2   Attention: Read list of letters (0/1) 1  Attention: Serial 7 subtraction starting at 100 (0/3) 3  Language: Repeat phrase (0/2) 2  Language : Fluency (0/1) 0  Abstraction (0/2) 1  Delayed Recall (0/5) 2  Orientation (0/6) 6  Total 25  Adjusted Score (based on education) 25   Test Procedures  Wide Range Achievement Test - 4             Word Reading Doy Mince' Intellectual Screening Test Neuropsychological Assessment Battery  Memory Module  Naming  Digit Span Repeatable Battery for Jenna [name] Assessment of Neuropsychological Status (Form A)  Figure Copy  Judgment of Line Orientation  Coding  Figure Recall Jenna [name] Dot Counting Test A Random Letter Test Controlled Oral Word Association (F-A-S) Semantic Fluency (Animals) Trail Making Test A &  B Complex [employer] Material Modified [employer] Test Geriatric Depression Scale - Short Form Quick Dementia Rating System (completed by husband Josph Macho)  Plan  ATHENE SCHUHMACHER was seen for a psychiatric diagnostic evaluation and neuropsychological testing. Jenna [name] [name] is a pleasant 70 year old, right-hand dominant woman with a history of depression intermittently throughout Jenna [name] [name] adult life. Jenna [name] [name] is concerned about memory loss, but Jenna [name] [name] husband has not noticed much and is more concerned about behavior change in terms of irritability, anger, and diminished activity. Jenna [name] [name] is already in psychiatric care and was previously in counseling. On mental status screening, Jenna [name] [name] performance was good, although Jenna [name] [name] may have some subtle cognitive problems and testing will be helpful to further elucidate that and guide Jenna [name] [name] care. Regardless of Jenna [name] [name] performance I have Jenna [name] feeling that Jenna [name] [name] may benefit from behavioral activation, assertive medication management, and perhaps reengaging in counseling. Full and complete note with impressions, recommendations, and interpretation of test data to follow.   Viviano Simas Nicole Kindred, PsyD, [facility] Clinical Neuropsychologist  Informed Consent   Risks and benefits of Jenna [name] evaluation were discussed with Jenna [name] [name] prior to all testing procedures. I conducted a clinical interview and neuropsychological testing (at least two tests) with [name] and Jenna [name] Benes, B.S. (Technician) administered additional test procedures. Jenna [name] [name] was able to tolerate Jenna [name] testing procedures and Jenna [name] [name] (and/or family if applicable) is likely to benefit from further follow up to receive Jenna [name] diagnosis and treatment recommendations, which will be rendered at Jenna [name] next encounter.

## 2020-11-29 NOTE — Progress Notes (Signed)
[facility]  Patient Name: BABBIE DONDLINGER MRN: [id number] Date of Birth: March 15, 1951 Age: 70 y.o. Education: 14 years  Measurement properties of test scores: IQ, Index, and Standard Scores (SS): Mean = 100; Standard Deviation = 15 Scaled Scores (Ss): Mean = 10; Standard Deviation = 3 Z scores (Z): Mean = 0; Standard Deviation = 1 T scores (T); Mean = 50; Standard Deviation = 10  TEST SCORES:    Note: This summary of test scores accompanies the interpretive report and should not be interpreted by unqualified individuals or in isolation without reference to the report. Test scores are relative to age, gender, and educational history as available and appropriate.   Performance Validity        "A" Random Letter Test Raw  Descriptor      Errors 0 Within Expectation  The Dot Counting Test: 13 Within Expectation      Embedded Measures: Raw  Descriptor      NAB Effort Index 0 Within Expectation      Mental Status Screening     Total Score Descriptor  MoCA 25 Normal      Expected Functioning        Wide Range Achievement Test: Standard/Scaled Score Percentile      Word Reading 107 68      Reynolds Intellectual Screening Test Standard/T-score Percentile      Guess IHKV 42 59      Odd Item Out 57 75  RIST Index 110 75      Attention/Processing Speed        Neuropsychological Assessment Battery (Attention Module, Form 1): Scaled/T-score Percentile      Digits Forward 65 93      Digits Backwards 43 25      Repeatable Battery for the Assessment of Neuropsychological Status (Form A): Standard Score Percentile     Coding 8 25      Language        Neuropsychological Assessment Battery (Language Module, Form 1): T-score Percentile      Naming   (30) 56 73      Verbal Fluency:  T Score Percentile      Controlled Oral Word Association (F-A-S) 41 18      Semantic Fluency (Animals) 42 21      Memory:        Neuropsychological Assessment  Battery (Memory Module, Form 1): T-score/Standard Score Percentile  Memory Index (MEM): 99 47      List Learning           List A Immediate Recall   (5 , 7 , 7) 42 21         List B Immediate Recall   (3) 41 18         List A Short Delayed Recall   (7) 48 42         List A Long Delayed Recall   (5) 40 16         List A Percent Retention   (71 %) --- 18         List A Long Delayed Yes/No Recognition Hits   (11) --- 54         List A Long Delayed Yes/No Recognition False Alarms   (4) --- 42         List A Recognition Discriminability Index --- 46      Shape Learning           Immediate Recognition   (  5 , 9 , 8) 68 96         Delayed Recognition   (9) 69 97         Percent Retention   (113 %) --- 69         Delayed Forced-Choice Recognition Hits   (8) --- 58         Delayed Forced-Choice Recognition False Alarms   (0) --- 75         Delayed Forced-Choice Recognition Discriminability --- 75      Story Learning           Immediate Recall   (16, 23) 30 2         Delayed Recall   (24) 36 8         Percent Retention   (104 %) --- 79      Daily Living Memory            Immediate Recall   (26, 22) 64 92          Delayed Recall   (8, 7) 54 66          Percent Retention (88 %) --- 54          Recognition Hits   (9) --- 58      Repeatable Battery for the Assessment of Neuropsychological Status (Form A): Scaled Score Percentile         Figure Recall   (14) 11 63      Visuospatial/Constructional Functioning        Repeatable Battery for the Assessment of Neuropsychological Status (Form A): Standard/Scaled Score Percentile      Visuospatial/Constructional Index 116 86         Figure Copy   (19) 11 63         Judgment of Line Orientation   (20) --- >75      Executive Functioning        Modified [place] Card Sorting Test (MWCST): Standard/T-Score Percentile      Number of Categories Correct 57 75      Number of Perseverative Errors 53 62      Number of Total Errors 52 58      Percent  Perseverative Errors 52 58  Executive Function Composite 108 70      Trail Making Test: T-Score Percentile      Part A 55 69      Part B 49 46      Boston Diagnostic Aphasia Exam: Raw Score Scaled Score      Complex Ideational Material 11 9      Clock Drawing Raw Score Descriptor      Command 10 WNL      Rating Scales         Raw Score Descriptor  Patient Health Questionnaire - 9 10 Moderate  GAD-7 10 Moderate      Clinical Dementia Rating Raw Score Descriptor      Sum of Boxes 0.0 Normal      Global Score 0.0 Normal      Quick Dementia Rating System Raw Score Descriptor      Sum of Boxes 2 MCI      Total Score 6 Mild Dementia   Laronica Bhagat V. Nicole Kindred PsyD, [place] Clinical Neuropsychologist

## 2020-11-30 NOTE — Progress Notes (Signed)
[facility]  Patient Name: Jenna [name] MRN: [id number] Date of Birth: 22-Apr-1951 Age: 70 y.o. Education: 48 years  Clinical Impressions  MERON BOCCHINO is a 70 y.o., right-hand dominant, married woman with a history of HTN, anxiety, depression and more recently anger outbursts who was referred by her PCP Lennie Odor, PA with [facility]. The patient and her husband reported that her mother died from complications related to Alzheimer's dementia 5 years ago and she is concerned that she is getting the condition. The only cognitive changes she has noticed, however, are that it takes her longer to recall things and she left her phone at work several times 1.5 years ago. She has no clear functional impairment due to cognitive loss and her husband does not appreciate any repeating herself or other characteristic signs/symptoms. Her husband is more concerned about behavior changes and describes her as "a totally different person" over the past several years. She is less energetic, does not initiate many activities, and is also quite irritable. The patient herself admits that she is not as happy as she used to be but was somewhat alexithymic. Importantly, she presented as reasonable and having good insight into the fact that her anger is inappropriate. They deny any inappropriate social behaviors, hoarding, loss of social graces/decorum, hyperorality, or diminished sympathy/empathy.   On neuropsychological assessment, Ms. Bridwell demonstrated performance within expectation for her in all areas assessed. Specifically, she demonstrated average memory performance overall despite a couple of related low scores. Her performance was within normal limits on visual object confrontation naming, generation of words in a given category, and on challenging measures of executive abilities, which are viewed as most germane to her concern of Alzheimer's. She did have some  marginal scores on select measures of verbal memory, but this is primarily due to encoding issues and she retained information well over time.   My sense is that her issues relate more to an affective disorder than they do a neurological disorder. While she did have some low scores on memory testing they are mainly due to encoding issues and her overall performance was reasonable, which is viewed as the more reliable indicator in her case. She may have some interference from depression. I will discuss with the patient the importance of reengaging in psychotherapy. She is already in psychiatric treatment and I would further recommend that she work with them on assertive medication management. Behavioral activation is also likely to be extremely helpful, given her current limited behavioral repertoire. Reevaluation can be considered as needed but is not necessary from a neuropsychological perspective unless she feels that her difficulties are worsening over time.   Diagnostic Impressions: Major depressive disorder, moderate, currently active, unspecified whether recurrent  Recommendations to be discussed with patient  Your performance and presentation on assessment were consistent with normal performance in all areas. That is to say, you did not have any consistent patterns of low scores concering for the presence of impairment in any domain. Your memory also fell at a reasonable average level, which is within the expected range for you. I would thus like to reassure you that there are no indications from a testing perspective of a developing cognitive disorder that is at risk for decline.   Importantly, I do think that you are having some cognitive interference related to your depression. You had a hard time talking about your feelings but do admit that you are not as happy as in the past and that  you do not have motivation to do many things. Depression can affect cognitive functioning in several ways. For  one, there are neurobiological changes in depression that can contribute to attention, concentration, and memory problems. These changes are so common they are part of the criteria we use to diagnose depressive disorders. Depression can also negatively impact your own appraisal of your cognitive abilities, leading you to feel like you are performing more poorly than is objectively warranted. You are already in medication management and I would suggest that you consider augmenting with psychotherapy. Couples therapy could also be helpful because these problems are clearly affecting your relationship.   Some cognitive abilities (such as processing speed) naturally decline with age, but there are many things you can do to contribute to healthy cognitive aging. There is evidence from at least one study that a modified low carbohydrate mediterranean diet (the MIND-DASH) diet can contribute to healthy cognitive aging. There is also some evidence to suggest a beneficial effect of coffee (black coffee without sugar or other additives such as cream) for healthy aging. One glass of red wine a day may also contribute to healthy aging though at two drinks you lose all benefit and at three drinks it may be doing more harm than good. Staying active, mentally and physically, are also crucially important. One of the best ways to do this is simply to stay active and engaged in your life, particularly with social activities. Challenging the mind and other cognitively stimulating activities are encouraged, consider learning a new hobby, reconnecting with old friends, reading an interesting and thought provoking book. It is not so much what you do that is important as it is that you enjoy it and stay at it.   Avoid overfocusing on cognitive performance. Memory and cognition are notoriously fallible and if you are looking for cognitive problems, you are bound to find them. Once someone gets worried about their memory and thinking,  they may overfocus on how they are doing day-to-day, and then when normal day-to-day cognitive errors are made, this becomes a cause for more concern. This concern and anxiety then decreases focus from the task at hand, reducing concentration, causing more cognitive problems, and creating a vicious cycle. Rather than critiquing your performance, I would encourage you to remain present minded and focus on the task at hand. Perhaps most importantly, have reasonable expectations for yourself.  Healthy people forget things, lose focus, and do not perform 100% correctly all the time. Some cognitive errors are normal and are not necessarily a sign that there is something wrong with your brain.   In addition to psychotherapy and psychiatric treatment, I would recommend that you immediately begin behavioral activation. There is a significant research base and evidence of effectiveness for something called "behavioral activation," which is a fancy way of saying that you should increase your activity level. In general, people do not feel as happy or do as well when they are not doing much. This can include things like getting out for walks, re-engaging in hobbies, spending time with family or friends, or learning a new hobby. It's not so important what you do as that you enjoy it and stick with it. Depression can start a vicious cycle where you are not doing a lot because you don't feel well, which leads to less things to be excited and happy about, and thus more depression and behavioral avoidance. It can be hard to change this pattern once it has started but most people find  that they feel better when they start doing more even if they don't enjoy it at first.    You can return for repeat evaluation if desired in the future (no sooner than 1 year), although my sense is that your memory and thinking problems will improve as your mood and affective issues improve.   Test Findings  Test scores are summarized in  additional documentation associated with this encounter. Test scores are relative to age, gender, and educational history as available and appropriate. There were no concerns about performance validity as all findings fell within normal expectations.   General Intellectual Functioning/Achievement:  Performance on single word reading was average, toward the upper aspect of the average range. Performance on the RIST index was high average, which was used as a basis of comparison for her cognitive test scores in other areas.   Attention and Processing Efficiency: Indicators of attention and processing efficiency were reasonable, although she did have a relative weakness in working memory as compared to simple auditory attention with superior digit repetition forward and low average digit repetition backward. Timed number-symbol coding was average.   Language: Performance on language measures was normal with respect to visual object confrontation naming. Generation of words was low average both in response to category prompts and the letters F-A-S.   Visuospatial Function: Visuospatial and constructional measures were high average at an index level. She performed nearly perfectly on figure copy, generating an average score and her judgment of angular line orientations was in fact errorless.   Learning and Memory: Indicators of learning and memory fell at an average level overall, which is within expectations on the basis of her RIST index score. She did have an unusual number of low scores although the pattern shows isolated difficulties with encoding of information in the setting of good retention across time and there is no suggestion of a storage problem. The findings overall suggest adequate acquisition and retention of information across time.   In the verbal realm, Ms. Theresia Bough demonstrated low average immediate recall for a 12-item word list with 5, 7, and 7 words of a 12-item word list across  three learning trials. She retained 7 words on short delayed recall, which is average, but then 5 on long delayed recall, although that is still a low average score. Recognition discriminability for words from the list versus false choices was average. Memory for a short story was somewhat weaker although in this cause, it was due to encoding problems with unusually low immediate recall. She actually retained more information than she had initially encoded during learning trials yet her delayed recall score was still unusually low. Memory for brief daily living information was superior on immediate recall and average on delayed recall. Delayed yes/no recognition was average.   In the visual realm, she did very well, learning a series of designs that are difficult to verbally encode with superior faculty, average retention of information, and superior delayed recognition performance. Yes/no discriminability for the target designs versus false choices was high average. Delayed free recall for a modestly complex figure was average.   Executive Functions: Performance was good on all executive indicators without any significant areas of low performance. She scored at an average (nearly high average) level on the Modified [employer], a rule-based categorization procedure emphasizing cognitive flexibility and concept formation. She performed at an average level on alternating sequencing of numbers and letters and reasoning with verbal information on the Complex Ideational Material. Her clock drawing was  WNL with reasonable face formation, numbering, and hand placement. Generation of words in response to the letters F-A-S was low average.   Rating Scale(s): Ms. Hochberg screened positive for the presence of moderate levels of anxiety and depression, although she did endorse mostly somatic symptoms. Her husband characterized her as functioning anywhere from an MCI to a mild dementia range, although  that is mostly due to behavior changes and especially mood problems. I was able to rate a CDR for her and would give her a Sum of Boxes and global score of 0, because she does not have any credible cognitive changes.   Viviano Simas Nicole Kindred, PsyD, ABN Clinical Neuropsychologist  Coding and Compliance  Billing below reflects technician time, my direct face-to-face time with the patient, time spent in test administration, and time spent in professional activities including but not limited to: neuropsychological test interpretation, integration of neuropsychological test data with clinical history, report preparation, treatment planning, care coordination, and review of diagnostically pertinent medical history or studies.   Services associated with this encounter: Clinical Interview [phone]) plus 160 minutes (96132/96133; Neuropsychological Evaluation by Professional)  20 minutes (96136/96137; Test Administration by Professional) 115 minutes (96138/96139; Neuropsychological Testing by Technician)

## 2020-12-06 ENCOUNTER — Ambulatory Visit (INDEPENDENT_AMBULATORY_CARE_PROVIDER_SITE_OTHER): Payer: Medicare HMO | Admitting: Counselor

## 2020-12-06 ENCOUNTER — Other Ambulatory Visit: Payer: Self-pay

## 2020-12-06 ENCOUNTER — Encounter: Payer: Self-pay | Admitting: Counselor

## 2020-12-06 DIAGNOSIS — R69 Illness, unspecified: Secondary | ICD-10-CM | POA: Diagnosis not present

## 2020-12-06 DIAGNOSIS — F321 Major depressive disorder, single episode, moderate: Secondary | ICD-10-CM

## 2020-12-06 NOTE — Progress Notes (Signed)
   NEUROPSYCHOLOGY FEEDBACK NOTE [facility]  Feedback Note: I met with Donaciano Eva Sappington to review the findings resulting from her neuropsychological evaluation. Since the last appointment, she has been about the same. Time was spent reviewing the impressions and recommendations that are detailed in the evaluation report. We discussed impression of benign cognitive test data with concern for significant affective issues. She is aware she is depressed and was willing to re-establish with Burnetta Sabin at [facility] for care. I also discussed with her the role for medications, as she continues to be quite symptomatic, so titration may be in order. She will discuss with Maxcine Ham at [facility]. I took time to explain the findings and answer all the patient's questions. I encouraged Ms. Sopher to contact me should she have any further questions or if further follow up is desired.   Current Medications and Medical History   Current Outpatient Medications  Medication Sig Dispense Refill  . acetaminophen (TYLENOL) 325 MG tablet Take 2 tablets (650 mg total) by mouth every 6 (six) hours as needed for mild pain or fever (or Fever >/= 101). 12 tablet 0  . amLODipine (NORVASC) 10 MG tablet Take 1 tablet (10 mg total) by mouth daily. For BP 30 tablet 3  . atenolol (TENORMIN) 50 MG tablet Take 1 tablet (50 mg total) by mouth 2 (two) times daily. For BP 60 tablet 5  . atorvastatin (LIPITOR) 20 MG tablet Take 20 mg by mouth daily.    . calcium-vitamin D (OSCAL WITH D) 500-200 MG-UNIT tablet Take 1 tablet by mouth daily.    Marland Kitchen lisinopril (ZESTRIL) 20 MG tablet Take 1 tablet (20 mg total) by mouth daily. For BP 30 tablet 11   No current facility-administered medications for this visit.    Patient Active Problem List   Diagnosis Date Noted  . Hyponatremia 04/22/2019  . Hypokalemia 04/22/2019  . Leukocytosis 04/22/2019  . Palpitations 04/22/2019  . Anxiety 04/22/2019  . Hypertension  04/22/2019    Mental Status and Behavioral Observations  Brandice Busser Mervin presented on time to the present encounter and was alert and generally oriented. Speech was normal in rate, rhythm, volume, and prosody. Self-reported mood was "allright" and affect was dysphoric. Thought process was logical and goal oriented and thought content was appropriate to the topics discussed. There were no safety concerns identified at today's encounter, such as thoughts of harming self or others.   Plan  Feedback provided regarding the patient's neuropsychological evaluation. She is doing fairly well cognitively, with some low scores that may relate to her currently active depressive order. I was candid with her that I am not concerned about AD at this point in time and encouraged her not to worry unless there are significant changes. RADHA COGGINS was encouraged to contact me if any questions arise or if further follow up is desired.   Viviano Simas Nicole Kindred, PsyD, ABN Clinical Neuropsychologist  Service(s) Provided at This Encounter: 22 minutes [phone]; Psychotherapy with patient/family)

## 2020-12-06 NOTE — Patient Instructions (Signed)
Your performance and presentation on assessment were consistent with normal performance in all areas. That is to say, you did not have any consistent patterns of low scores concering for the presence of impairment in any domain. Your memory also fell at a reasonable average level, which is within the expected range for you. I would thus like to reassure you that there are no indications from a testing perspective of a developing cognitive disorder that is at risk for decline.   Importantly, I do think that you are having some cognitive interference related to your depression. You had a hard time talking about your feelings but do admit that you are not as happy as in the past and that you do not have motivation to do many things. Depression can affect cognitive functioning in several ways. For one, there are neurobiological changes in depression that can contribute to attention, concentration, and memory problems. These changes are so common they are part of the criteria we use to diagnose depressive disorders. Depression can also negatively impact your own appraisal of your cognitive abilities, leading you to feel like you are performing more poorly than is objectively warranted. You are already in medication management and I would suggest that you consider augmenting with psychotherapy. Couples therapy could also be helpful because these problems are clearly affecting your relationship.   Some cognitive abilities (such as processing speed) naturally decline with age, but there are many things you can do to contribute to healthy cognitive aging. There is evidence from at least one study that a modified low carbohydrate mediterranean diet (the MIND-DASH) diet can contribute to healthy cognitive aging. There is also some evidence to suggest a beneficial effect of coffee (black coffee without sugar or other additives such as cream) for healthy aging. One glass of red wine a day may also contribute to healthy  aging though at two drinks you lose all benefit and at three drinks it may be doing more harm than good. Staying active, mentally and physically, are also crucially important. One of the best ways to do this is simply to stay active and engaged in your life, particularly with social activities. Challenging the mind and other cognitively stimulating activities are encouraged, consider learning a new hobby, reconnecting with old friends, reading an interesting and thought provoking book. It is not so much what you do that is important as it is that you enjoy it and stay at it.   Avoid overfocusing on cognitive performance. Memory and cognition are notoriously fallible and if you are looking for cognitive problems, you are bound to find them. Once someone gets worried about their memory and thinking, they may overfocus on how they are doing day-to-day, and then when normal day-to-day cognitive errors are made, this becomes a cause for more concern. This concern and anxiety then decreases focus from the task at hand, reducing concentration, causing more cognitive problems, and creating a vicious cycle. Rather than critiquing your performance, I would encourage you to remain present minded and focus on the task at hand. Perhaps most importantly, have reasonable expectations for yourself.  Healthy people forget things, lose focus, and do not perform 100% correctly all the time. Some cognitive errors are normal and are not necessarily a sign that there is something wrong with your brain.   In addition to psychotherapy and psychiatric treatment, I would recommend that you immediately begin behavioral activation. There is a significant research base and evidence of effectiveness for something called "behavioral activation," which is  a fancy way of saying that you should increase your activity level. In general, people do not feel as happy or do as well when they are not doing much. This can include things like getting  out for walks, re-engaging in hobbies, spending time with family or friends, or learning a new hobby. It's not so important what you do as that you enjoy it and stick with it. Depression can start a vicious cycle where you are not doing a lot because you don't feel well, which leads to less things to be excited and happy about, and thus more depression and behavioral avoidance. It can be hard to change this pattern once it has started but most people find that they feel better when they start doing more even if they don't enjoy it at first.    You can return for repeat evaluation if desired in the future (no sooner than 1 year), although my sense is that your memory and thinking problems will improve as your mood and affective issues improve.

## 2020-12-26 DIAGNOSIS — F419 Anxiety disorder, unspecified: Secondary | ICD-10-CM | POA: Diagnosis not present

## 2020-12-26 DIAGNOSIS — R69 Illness, unspecified: Secondary | ICD-10-CM | POA: Diagnosis not present

## 2021-01-16 DIAGNOSIS — R69 Illness, unspecified: Secondary | ICD-10-CM | POA: Diagnosis not present

## 2021-01-23 DIAGNOSIS — R69 Illness, unspecified: Secondary | ICD-10-CM | POA: Diagnosis not present

## 2021-01-31 DIAGNOSIS — D2271 Melanocytic nevi of right lower limb, including hip: Secondary | ICD-10-CM | POA: Diagnosis not present

## 2021-01-31 DIAGNOSIS — L814 Other melanin hyperpigmentation: Secondary | ICD-10-CM | POA: Diagnosis not present

## 2021-01-31 DIAGNOSIS — D485 Neoplasm of uncertain behavior of skin: Secondary | ICD-10-CM | POA: Diagnosis not present

## 2021-01-31 DIAGNOSIS — Z85828 Personal history of other malignant neoplasm of skin: Secondary | ICD-10-CM | POA: Diagnosis not present

## 2021-01-31 DIAGNOSIS — L570 Actinic keratosis: Secondary | ICD-10-CM | POA: Diagnosis not present

## 2021-01-31 DIAGNOSIS — C44612 Basal cell carcinoma of skin of right upper limb, including shoulder: Secondary | ICD-10-CM | POA: Diagnosis not present

## 2021-01-31 DIAGNOSIS — L821 Other seborrheic keratosis: Secondary | ICD-10-CM | POA: Diagnosis not present

## 2021-01-31 DIAGNOSIS — Z808 Family history of malignant neoplasm of other organs or systems: Secondary | ICD-10-CM | POA: Diagnosis not present

## 2021-01-31 DIAGNOSIS — L578 Other skin changes due to chronic exposure to nonionizing radiation: Secondary | ICD-10-CM | POA: Diagnosis not present

## 2021-01-31 DIAGNOSIS — D225 Melanocytic nevi of trunk: Secondary | ICD-10-CM | POA: Diagnosis not present

## 2021-02-06 DIAGNOSIS — R69 Illness, unspecified: Secondary | ICD-10-CM | POA: Diagnosis not present

## 2021-02-14 DIAGNOSIS — R69 Illness, unspecified: Secondary | ICD-10-CM | POA: Diagnosis not present

## 2021-02-27 DIAGNOSIS — R69 Illness, unspecified: Secondary | ICD-10-CM | POA: Diagnosis not present

## 2021-03-02 DIAGNOSIS — Z1231 Encounter for screening mammogram for malignant neoplasm of breast: Secondary | ICD-10-CM | POA: Diagnosis not present

## 2021-03-17 DIAGNOSIS — R69 Illness, unspecified: Secondary | ICD-10-CM | POA: Diagnosis not present

## 2021-04-03 DIAGNOSIS — R69 Illness, unspecified: Secondary | ICD-10-CM | POA: Diagnosis not present

## 2021-04-03 DIAGNOSIS — F419 Anxiety disorder, unspecified: Secondary | ICD-10-CM | POA: Diagnosis not present

## 2021-05-12 ENCOUNTER — Ambulatory Visit (HOSPITAL_COMMUNITY)
Admission: RE | Admit: 2021-05-12 | Discharge: 2021-05-12 | Disposition: A | Discharge status: Home / Self Care | Payer: Medicare HMO | Attending: Psychiatry | Admitting: Psychiatry

## 2021-05-12 NOTE — H&P (Signed)
Behavioral Health Medical Screening Exam  Jenna [name] is an 70 y.o. female who presented to [facility] voluntarily for assessment with her daughter Jenna [name] [phone]).   Daughter reports mother has increased time spent in the bed and has not been attending to her ADLs (I.e. dressing, eating, showering). States patient has outpatient psychiatric services ([place], [facility]) in place but has stopped participating in therapy and taking medications for about "3 months".   Patient endorses "not taking care of myself", poor sleep (2-3 hours of sleep), anhedonia, increased feelings of guilt and worthlessness, decreased energy, poor concentration and appetite, and increased psychomotor activity including pacing during the day. Patient endorses periods of increased anxiety that affect her ability to sleep and function during the day. Denies any thoughts of suicide, however states she feel she has been a "burden" on her family but "can't figure out why". Patient denies any forgetfulness, but does endorse "not feeling the same". Both patient and daughter deny noticing any acute changes occurring at night or violent behavior. Patient denies any suicidal or homicidal ideations, auditory or visual hallucinations, and does not appear to be responding to any external/internal stimuli at this time.   Total Time spent with patient: 20 minutes  Psychiatric Specialty Exam: Physical Exam Vitals and nursing note reviewed.  Constitutional:      General: She is not in acute distress.    Appearance: She is not toxic-appearing.  HENT:     Head: Normocephalic.     Nose: Nose normal.     Mouth/Throat:     Mouth: Mucous membranes are moist.     Pharynx: Oropharynx is clear.  Eyes:     Pupils: Pupils are equal, round, and reactive to light.  Pulmonary:     Effort: Pulmonary effort is normal.  Abdominal:     General: Abdomen is flat.  Musculoskeletal:        General: Normal range of motion.      Cervical back: Normal range of motion.  Skin:    General: Skin is warm and dry.  Neurological:     Mental Status: She is alert. Mental status is at baseline.     Gait: Gait is intact.  Psychiatric:        Attention and Perception: Attention and perception normal.        Mood and Affect: Affect is flat.        Speech: Speech normal.        Behavior: Behavior is cooperative.        Thought Content: Thought content is not delusional. Thought content does not include homicidal or suicidal ideation. Thought content does not include homicidal or suicidal plan.        Cognition and Memory: Cognition is impaired.   Review of Systems  Psychiatric/Behavioral:  Positive for confusion, decreased concentration, dysphoric mood and sleep disturbance. Negative for suicidal ideas. The patient is nervous/anxious.   Blood pressure (!) 183/99, pulse 75, resp. rate 16, SpO2 100 %.There is no height or weight on file to calculate BMI. General Appearance: Casual and Fairly Groomed Eye Contact:  Fair Speech:  Clear and Coherent Volume:  Normal Mood:  Dysphoric and Worthless Affect:  Flat Thought Process:  Goal Directed Orientation:  Other:  person, place Thought Content:  Logical and Rumination Suicidal Thoughts:  No Homicidal Thoughts:  No Memory:  Immediate;   Poor Recent;   Fair Judgement:  Poor Insight:  Present and Shallow Psychomotor Activity:  Increased Concentration: Concentration: Fair and Attention  Span: Fair Recall:  [employer] of Knowledge:Good Language: Fair Akathisia:  NA Handed:  Right AIMS (if indicated):    Assets:  Financial Resources/Insurance Housing Intimacy Physical Health Resilience Social Support Transportation Vocational/Educational Sleep:     Musculoskeletal: Strength & Muscle Tone: within normal limits Gait & Station: normal Patient leans: N/A  Blood pressure (!) 183/99, pulse 75, resp. rate 16, SpO2 100 %.  Recommendations: Based on my evaluation the  patient does not appear to have an emergency medical condition. Patient discharged with recommendation to follow/maintain noted treatment plan and follow-up appointments via outpatient providers for continued care and stabilization.   Inda Merlin, NP 05/12/2021, 1:58 PM

## 2021-05-12 NOTE — BH Assessment (Signed)
Patient is a 70 year old female that presents this date with ongoing depression and anxiety. Patient denies any S/I, H/I or AVH. Patient states she currently receives OP services from [facility] who assists her with medication management for ongoing symptoms of depression. Patient states she discontinued those medications over three months ago because she didn't like the way they made her feel." Patient is vague in reference to symptoms stating "she gets nervous sometimes" although will not elaborate on content of statement. Patient's husband and daughter are present who assists with collateral information. Husband states patient has "really been getting angry with him" and he "just can't deal with it anymore" and is wanting patient to start back on medications. Patient denies access to firearms or history of SA use. Leevy-Johnson NP evaluated patient and recommended patient follow up with current provider although was provided with several resources to assist with counseling.

## 2021-05-17 DIAGNOSIS — R413 Other amnesia: Secondary | ICD-10-CM | POA: Diagnosis not present

## 2021-05-17 DIAGNOSIS — M810 Age-related osteoporosis without current pathological fracture: Secondary | ICD-10-CM | POA: Diagnosis not present

## 2021-05-17 DIAGNOSIS — R69 Illness, unspecified: Secondary | ICD-10-CM | POA: Diagnosis not present

## 2021-05-17 DIAGNOSIS — I10 Essential (primary) hypertension: Secondary | ICD-10-CM | POA: Diagnosis not present

## 2021-05-17 DIAGNOSIS — Z0000 Encounter for general adult medical examination without abnormal findings: Secondary | ICD-10-CM | POA: Diagnosis not present

## 2021-05-17 DIAGNOSIS — E7800 Pure hypercholesterolemia, unspecified: Secondary | ICD-10-CM | POA: Diagnosis not present

## 2021-05-17 DIAGNOSIS — Z23 Encounter for immunization: Secondary | ICD-10-CM | POA: Diagnosis not present

## 2021-05-17 DIAGNOSIS — E46 Unspecified protein-calorie malnutrition: Secondary | ICD-10-CM | POA: Diagnosis not present

## 2021-05-24 ENCOUNTER — Other Ambulatory Visit: Payer: Self-pay | Admitting: Physician Assistant

## 2021-05-24 DIAGNOSIS — R413 Other amnesia: Secondary | ICD-10-CM

## 2021-06-09 ENCOUNTER — Ambulatory Visit
Admission: RE | Admit: 2021-06-09 | Discharge: 2021-06-09 | Disposition: A | Discharge status: Home / Self Care | Payer: Medicare HMO | Source: Ambulatory Visit | Attending: Physician Assistant | Admitting: Physician Assistant

## 2021-06-09 DIAGNOSIS — R413 Other amnesia: Secondary | ICD-10-CM | POA: Diagnosis not present

## 2021-06-14 DIAGNOSIS — I10 Essential (primary) hypertension: Secondary | ICD-10-CM | POA: Diagnosis not present

## 2021-06-14 DIAGNOSIS — E7800 Pure hypercholesterolemia, unspecified: Secondary | ICD-10-CM | POA: Diagnosis not present

## 2021-06-14 DIAGNOSIS — F419 Anxiety disorder, unspecified: Secondary | ICD-10-CM | POA: Diagnosis not present

## 2021-06-14 DIAGNOSIS — R69 Illness, unspecified: Secondary | ICD-10-CM | POA: Diagnosis not present

## 2021-06-14 DIAGNOSIS — G479 Sleep disorder, unspecified: Secondary | ICD-10-CM | POA: Diagnosis not present

## 2021-07-13 DIAGNOSIS — R69 Illness, unspecified: Secondary | ICD-10-CM | POA: Diagnosis not present

## 2021-07-13 DIAGNOSIS — F419 Anxiety disorder, unspecified: Secondary | ICD-10-CM | POA: Diagnosis not present

## 2021-07-13 DIAGNOSIS — F331 Major depressive disorder, recurrent, moderate: Secondary | ICD-10-CM | POA: Diagnosis not present

## 2021-08-09 DIAGNOSIS — H524 Presbyopia: Secondary | ICD-10-CM | POA: Diagnosis not present

## 2021-08-09 DIAGNOSIS — H52223 Regular astigmatism, bilateral: Secondary | ICD-10-CM | POA: Diagnosis not present

## 2021-08-10 ENCOUNTER — Emergency Department (HOSPITAL_COMMUNITY)
Admission: EM | Admit: 2021-08-10 | Discharge: 2021-08-11 | Disposition: A | Discharge status: Other Acute Inpatient Hospital | Payer: Medicare HMO | Attending: Emergency Medicine | Admitting: Emergency Medicine

## 2021-08-10 DIAGNOSIS — E876 Hypokalemia: Secondary | ICD-10-CM | POA: Diagnosis not present

## 2021-08-10 DIAGNOSIS — F332 Major depressive disorder, recurrent severe without psychotic features: Secondary | ICD-10-CM | POA: Insufficient documentation

## 2021-08-10 DIAGNOSIS — T424X1A Poisoning by benzodiazepines, accidental (unintentional), initial encounter: Secondary | ICD-10-CM | POA: Insufficient documentation

## 2021-08-10 DIAGNOSIS — Z743 Need for continuous supervision: Secondary | ICD-10-CM | POA: Diagnosis not present

## 2021-08-10 DIAGNOSIS — T1491XA Suicide attempt, initial encounter: Secondary | ICD-10-CM

## 2021-08-10 DIAGNOSIS — I959 Hypotension, unspecified: Secondary | ICD-10-CM | POA: Diagnosis not present

## 2021-08-10 DIAGNOSIS — R5383 Other fatigue: Secondary | ICD-10-CM | POA: Diagnosis not present

## 2021-08-10 DIAGNOSIS — R45851 Suicidal ideations: Secondary | ICD-10-CM | POA: Insufficient documentation

## 2021-08-10 DIAGNOSIS — I10 Essential (primary) hypertension: Secondary | ICD-10-CM | POA: Insufficient documentation

## 2021-08-10 DIAGNOSIS — R42 Dizziness and giddiness: Secondary | ICD-10-CM | POA: Diagnosis not present

## 2021-08-10 DIAGNOSIS — Z79899 Other long term (current) drug therapy: Secondary | ICD-10-CM | POA: Insufficient documentation

## 2021-08-10 DIAGNOSIS — R69 Illness, unspecified: Secondary | ICD-10-CM | POA: Diagnosis not present

## 2021-08-10 DIAGNOSIS — Z20822 Contact with and (suspected) exposure to covid-19: Secondary | ICD-10-CM | POA: Insufficient documentation

## 2021-08-10 DIAGNOSIS — F32A Depression, unspecified: Secondary | ICD-10-CM

## 2021-08-10 DIAGNOSIS — Z1339 Encounter for screening examination for other mental health and behavioral disorders: Secondary | ICD-10-CM | POA: Diagnosis not present

## 2021-08-10 DIAGNOSIS — T887XXA Unspecified adverse effect of drug or medicament, initial encounter: Secondary | ICD-10-CM | POA: Diagnosis not present

## 2021-08-10 LAB — CBC WITH DIFFERENTIAL/PLATELET
Abs Immature Granulocytes: 0.03 10*3/uL (ref 0.00–0.07)
Basophils Absolute: 0 10*3/uL (ref 0.0–0.1)
Basophils Relative: 1 %
Eosinophils Absolute: 0.1 10*3/uL (ref 0.0–0.5)
Eosinophils Relative: 2 %
HCT: 37.5 % (ref 36.0–46.0)
Hemoglobin: 12.2 g/dL (ref 12.0–15.0)
Immature Granulocytes: 1 %
Lymphocytes Relative: 26 %
Lymphs Abs: 1.5 10*3/uL (ref 0.7–4.0)
MCH: 30.4 pg (ref 26.0–34.0)
MCHC: 32.5 g/dL (ref 30.0–36.0)
MCV: 93.5 fL (ref 80.0–100.0)
Monocytes Absolute: 0.4 10*3/uL (ref 0.1–1.0)
Monocytes Relative: 6 %
Neutro Abs: 3.8 10*3/uL (ref 1.7–7.7)
Neutrophils Relative %: 64 %
Platelets: 246 10*3/uL (ref 150–400)
RBC: 4.01 MIL/uL (ref 3.87–5.11)
RDW: 12.8 % (ref 11.5–15.5)
WBC: 5.9 10*3/uL (ref 4.0–10.5)
nRBC: 0 % (ref 0.0–0.2)

## 2021-08-10 LAB — ACETAMINOPHEN LEVEL: Acetaminophen (Tylenol), Serum: <10 ug/mL — ABNORMAL LOW (ref 10–30)

## 2021-08-10 LAB — HEMOGLOBIN A1C
Hgb A1c MFr Bld: 5.4 % (ref 4.8–5.6)
Mean Plasma Glucose: 108.28 mg/dL

## 2021-08-10 LAB — RAPID URINE DRUG SCREEN, HOSP PERFORMED
Amphetamines: NOT DETECTED
Barbiturates: NOT DETECTED
Benzodiazepines: POSITIVE — AB
Cocaine: NOT DETECTED
Opiates: NOT DETECTED
Tetrahydrocannabinol: NOT DETECTED

## 2021-08-10 LAB — LIPID PANEL
Cholesterol: 141 mg/dL (ref 0–200)
HDL: 59 mg/dL (ref >40)
LDL Cholesterol: 66 mg/dL (ref 0–99)
Total CHOL/HDL Ratio: 2.4 RATIO
Triglycerides: 82 mg/dL (ref <150)
VLDL: 16 mg/dL (ref 0–40)

## 2021-08-10 LAB — COMPREHENSIVE METABOLIC PANEL
ALT: 22 U/L (ref 0–44)
AST: 21 U/L (ref 15–41)
Albumin: 3.5 g/dL (ref 3.5–5.0)
Alkaline Phosphatase: 62 U/L (ref 38–126)
Anion gap: 4 — ABNORMAL LOW (ref 5–15)
BUN: 13 mg/dL (ref 8–23)
CO2: 27 mmol/L (ref 22–32)
Calcium: 8.1 mg/dL — ABNORMAL LOW (ref 8.9–10.3)
Chloride: 108 mmol/L (ref 98–111)
Creatinine, Ser: 0.53 mg/dL (ref 0.44–1.00)
GFR, Estimated: >60 mL/min (ref >60)
Glucose, Bld: 94 mg/dL (ref 70–99)
Potassium: 3.5 mmol/L (ref 3.5–5.1)
Sodium: 139 mmol/L (ref 135–145)
Total Bilirubin: 0.3 mg/dL (ref 0.3–1.2)
Total Protein: 6.1 g/dL — ABNORMAL LOW (ref 6.5–8.1)

## 2021-08-10 LAB — RESP PANEL BY RT-PCR (FLU A&B, COVID) ARPGX2
Influenza A by PCR: NEGATIVE
Influenza B by PCR: NEGATIVE
SARS Coronavirus 2 by RT PCR: NEGATIVE

## 2021-08-10 LAB — SALICYLATE LEVEL: Salicylate Lvl: <7 mg/dL — ABNORMAL LOW (ref 7.0–30.0)

## 2021-08-10 LAB — ETHANOL: Alcohol, Ethyl (B): <10 mg/dL (ref <10)

## 2021-08-10 LAB — TSH: TSH: 1.859 u[IU]/mL (ref 0.350–4.500)

## 2021-08-10 NOTE — ED Notes (Signed)
Pt belongings located in locker 29

## 2021-08-10 NOTE — ED Notes (Signed)
Pt is unable to provide urine sample at this time. 
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
## 2021-08-10 NOTE — ED Notes (Signed)
Spoke to pt's husband Jenna [name] for update. Could not get confirmation on the time pt ingested medication. He reports he went upstairs around 9. He reports she was up at 0530 threatening to hurt him and hurt herself as well as the dog. He took the dog outside and called EMS. He reports he had to take a steak knife away from her and that she may have hidden more around the house. Also reports pt was yelling at him yesterday and throwing things. He reports pt has been seeing psychiatrist/dr for mental health for years and he or daughter have accompanied her to appts. She has been off and on taking medications, has stopped taking all medications in the last few months. He reports off and on sx the last 2 years but that 'the last 3 days have been the worst ever.' Unable to identify trigger. He reports that 2-3 weeks ago she stated 'that's enough, I can't do this any more', called all the family and apologized, then made a hair appointment, although asked daughter to cut her hair off before that. He reports he thought things were going to change. Reports feeling unsafe with her in the home currently, endorses wish that there is somewhere for her to go.

## 2021-08-10 NOTE — BH Assessment (Signed)
[place] Assessment Progress Note   Per Sheran Fava, NP this pt requires psychiatric hospitalization.  Danika, RN, [facility] has assigned pt to [facility] Rm [id number] to the service of Dr Caswell Corwin.  [facility] will be ready to receive pt at 21:00.  Pt presents under IVC initiated by EDP Gareth Morgan, MD and IVC documents have been faxed to  [facility].  EDP Davonna Belling, MD and pt's nurse, Eustaquio Maize, have been notified, and Eustaquio Maize agrees to call report to [phone].  Pt is to be transported via [facility].   Jalene Mullet, [place] Coordinator [phone]

## 2021-08-10 NOTE — ED Triage Notes (Addendum)
Per EMS-coming from home-patient took 40 0.5 mg of her Klonopin in an attempt to harm herself-has been having suicidal thoughts for awhile but never acted on them-started taking pills around 7 pm last night-300 cc of NS given in route

## 2021-08-10 NOTE — ED Notes (Signed)
Spoke to [place] with poison control. Recommendations as follows:  Tylenol level, ethanol level, CMP If all normal, including LFT, pt can be considered medically cleared  Attempt to confirm time of ingestion. If stated time (7p-9p last night) is accurate, pt should not be worsening. If unable to confirm, recommended to obs at least 6 hours after arrival.
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
## 2021-08-10 NOTE — ED Provider Notes (Signed)
[facility] DEPT Provider Note   CSN: [id number] Arrival date & time: 08/10/21  0654     History Chief Complaint  Patient presents with   Suicide Attempt    Jenna [name] is a 71 y.o. female.  HPI     70 year old female with a history of hypertension, hyperlipidemia, anxiety presents with concern for suicide attempt by overdosing on 40 tablets of 0.5 mg Klonopin.  When I ask if she would like to talk more about what happened/.her symptoms she states "no." Reported that she began taking the pills around 7 PM last night to EMS.  Reports to me she took a handful around 9PM.  She denies taking any other medications, alcohol or drugs last night.  Reports that she has had some depression and thoughts in the past, but nothing like this.  Reports that she still has suicidal ideation, admits that she did this to hurt her self.  Her husband is the one who called EMS.  Other than feeling significant fatigue, however she still cannot sleep-- she has no other medical concerns, no abdominal pain, nausea, vomiting or shortness of breath.  She is reluctant to provide other history.  On chart review, she has a visit to [place] in September when daughter had noted increased time spent in bed, not attending to ADLs, had outpatient psychiatric services with low Exie Parody, [facility] in place but had stopped participating in therapy taking medications for about 3 months, patient had reported "not taking care of myself", poor sleep, anhedonia, feelings of guilt and worthlessness, decreased energy, poor concentration and appetite, pacing during the day.  Past Medical History:  Diagnosis Date   Anxiety    Hypertension    Hyponatremia     Patient Active Problem List   Diagnosis Date Noted   Hyponatremia 04/22/2019   Hypokalemia 04/22/2019   Leukocytosis 04/22/2019   Palpitations 04/22/2019   Anxiety 04/22/2019   Hypertension 04/22/2019    No past  surgical history on file.   OB History   No obstetric history on file.     Family History  Problem Relation Age of Onset   Hypertension Mother     Social History   Tobacco Use   Smoking status: Never   Smokeless tobacco: Never  Substance Use Topics   Alcohol use: Not Currently    Alcohol/week: 0.0 standard drinks   Drug use: Not Currently    Home Medications Prior to Admission medications   Medication Sig Start Date End Date Taking? Authorizing Provider  amLODipine (NORVASC) 10 MG tablet Take 1 tablet (10 mg total) by mouth daily. For BP 04/23/19  Yes Emokpae, Courage, MD  atenolol (TENORMIN) 50 MG tablet Take 1 tablet (50 mg total) by mouth 2 (two) times daily. For BP 04/23/19  Yes Emokpae, Courage, MD  atorvastatin (LIPITOR) 20 MG tablet Take 20 mg by mouth daily.   Yes [provider]  clonazePAM (KLONOPIN) 0.5 MG tablet Take 0.5-1 mg by mouth at bedtime. 07/26/21  Yes [provider]  denosumab (PROLIA) 60 MG/ML SOSY injection 60 mg every 6 (six) months.   Yes [provider]  FLUoxetine (PROZAC) 40 MG capsule Take 40 mg by mouth every morning. 07/13/21  Yes [provider]  lisinopril (ZESTRIL) 20 MG tablet Take 1 tablet (20 mg total) by mouth daily. For BP Patient taking differently: Take 20 mg by mouth in the morning. For BP 04/23/19 09/10/21 Yes Emokpae, Courage, MD  acetaminophen (TYLENOL) 325  MG tablet Take 2 tablets (650 mg total) by mouth every 6 (six) hours as needed for mild pain or fever (or Fever >/= 101). Patient not taking: Reported on 08/10/2021 04/23/19   Roxan Hockey, MD  busPIRone (BUSPAR) 15 MG tablet Take 15 mg by mouth 2 (two) times daily. 07/13/21   [provider]  calcium-vitamin D (OSCAL WITH D) 500-200 MG-UNIT tablet Take 1 tablet by mouth daily. Patient not taking: Reported on 08/10/2021    [provider]    Allergies    Amitriptyline hcl, Fluoxetine, and Trazodone hcl  Review of Systems    Review of Systems  Unable to perform ROS: Psychiatric disorder  Constitutional:  Positive for fatigue. Negative for fever.  Respiratory:  Negative for cough and shortness of breath.   Cardiovascular:  Negative for chest pain.  Gastrointestinal:  Negative for abdominal pain, nausea and vomiting.  Skin:  Negative for rash.  Neurological:  Negative for syncope.  Psychiatric/Behavioral:  Positive for self-injury and suicidal ideas.    Physical Exam Updated Vital Signs BP 111/73   Pulse 60   Temp 98.3 F (36.8 C) (Oral)   Resp 15   Ht 4\' 10"  (1.473 m)   Wt 36.3 kg   SpO2 99%   BMI 16.72 kg/m   Physical Exam Vitals and nursing note reviewed.  Constitutional:      General: She is not in acute distress.    Appearance: She is well-developed. She is not diaphoretic.  HENT:     Head: Normocephalic and atraumatic.  Eyes:     Conjunctiva/sclera: Conjunctivae normal.  Cardiovascular:     Rate and Rhythm: Normal rate and regular rhythm.     Heart sounds: Normal heart sounds. No murmur heard.   No friction rub. No gallop.  Pulmonary:     Effort: Pulmonary effort is normal. No respiratory distress.     Breath sounds: Normal breath sounds. No wheezing or rales.  Abdominal:     General: There is no distension.     Palpations: Abdomen is soft.     Tenderness: There is no abdominal tenderness. There is no guarding.  Musculoskeletal:        General: No tenderness.     Cervical back: Normal range of motion.  Skin:    General: Skin is warm and dry.     Findings: No erythema or rash.  Neurological:     Mental Status: She is alert and oriented to person, place, and time.    ED Results / Procedures / Treatments   Labs (all labs ordered are listed, but only abnormal results are displayed) Labs Reviewed  COMPREHENSIVE METABOLIC PANEL - Abnormal; Notable for the following components:      Result Value   Calcium 8.1 (*)    Total Protein 6.1 (*)    Anion gap 4 (*)    All other  components within normal limits  RAPID URINE DRUG SCREEN, HOSP PERFORMED - Abnormal; Notable for the following components:   Benzodiazepines POSITIVE (*)    All other components within normal limits  ACETAMINOPHEN LEVEL - Abnormal; Notable for the following components:   Acetaminophen (Tylenol), Serum <10 (*)    All other components within normal limits  SALICYLATE LEVEL - Abnormal; Notable for the following components:   Salicylate Lvl <9.5 (*)    All other components within normal limits  RESP PANEL BY RT-PCR (FLU A&B, COVID) ARPGX2  ETHANOL  CBC WITH DIFFERENTIAL/PLATELET    EKG EKG Interpretation  Date/Time:  Thursday August 10 2021 11:31:44 EST Ventricular Rate:  54 PR Interval:  166 QRS Duration: 105 QT Interval:  441 QTC Calculation: 418 R Axis:   30 Text Interpretation: Sinus rhythm No significant change since last tracing Confirmed by Gareth Morgan [phone]) on 08/10/2021 12:08:26 PM  Radiology No results found.  Procedures Procedures   Medications Ordered in ED Medications - No data to display  ED Course  I have reviewed the triage vital signs and the nursing notes.  Pertinent labs & imaging results that were available during my care of the patient were reviewed by me and considered in my medical decision making (see chart for details).    MDM Rules/Calculators/A&P                            70 year old female with a history of hypertension, hyperlipidemia, anxiety presents with concern for suicide attempt by overdosing on 40 tablets of 0.5 mg Klonopin.  She is sleepy, however awake and alert on my examination with normal vital signs.  Labs sent show no acute abnormalities.  She is medically cleared.  IVC placed given suicide attempt. Plan for inpatient treatment per TTS.   Final Clinical Impression(s) / ED Diagnoses Final diagnoses:  Suicide attempt [facility])  Depression, unspecified depression type    Rx / DC Orders ED Discharge Orders      None        Gareth Morgan, MD 08/10/21 1311

## 2021-08-10 NOTE — ED Notes (Signed)
Received information from pt's husband that pt had a bottle of 40mg  fluoxetine tablets. 6 pill remain in bottle. He states she has not taken them in some time but cannot be certain she did not access them. Provided new information to poison control and received recommendations as follows:  Obs for 6 hrs after husband found pt (0530), repeat EKG. If Qtc is normal, pt is considered medically clear. At this time, labs and vitals appropriate for clearance and no repeat needed. Provider aware.

## 2021-08-10 NOTE — ED Notes (Signed)
Pt threw cup of water at the door. "He's [husband] talking about me"

## 2021-08-10 NOTE — ED Notes (Signed)
Pt belongings placed in cabinet at 19[address]22 section. Belongings contain shirt, bra, pants, shoes, socks, purse with keys, 2 pair glasses, wallet, phone, pen, credit cards

## 2021-08-11 ENCOUNTER — Other Ambulatory Visit: Payer: Self-pay

## 2021-08-11 ENCOUNTER — Encounter (HOSPITAL_COMMUNITY): Payer: Self-pay | Admitting: Family

## 2021-08-11 ENCOUNTER — Inpatient Hospital Stay (HOSPITAL_COMMUNITY)
Admission: AD | Admit: 2021-08-11 | Discharge: 2021-08-24 | DRG: 885 | Disposition: A | Discharge status: Home / Self Care | Payer: Medicare HMO | Source: Intra-hospital | Attending: Emergency Medicine | Admitting: Emergency Medicine

## 2021-08-11 ENCOUNTER — Encounter (HOSPITAL_COMMUNITY): Payer: Self-pay

## 2021-08-11 DIAGNOSIS — Z8249 Family history of ischemic heart disease and other diseases of the circulatory system: Secondary | ICD-10-CM | POA: Diagnosis not present

## 2021-08-11 DIAGNOSIS — R197 Diarrhea, unspecified: Secondary | ICD-10-CM | POA: Diagnosis not present

## 2021-08-11 DIAGNOSIS — E46 Unspecified protein-calorie malnutrition: Secondary | ICD-10-CM | POA: Diagnosis not present

## 2021-08-11 DIAGNOSIS — Z888 Allergy status to other drugs, medicaments and biological substances status: Secondary | ICD-10-CM

## 2021-08-11 DIAGNOSIS — G4700 Insomnia, unspecified: Secondary | ICD-10-CM | POA: Diagnosis not present

## 2021-08-11 DIAGNOSIS — G8929 Other chronic pain: Secondary | ICD-10-CM | POA: Diagnosis not present

## 2021-08-11 DIAGNOSIS — Z681 Body mass index (BMI) 19 or less, adult: Secondary | ICD-10-CM

## 2021-08-11 DIAGNOSIS — F332 Major depressive disorder, recurrent severe without psychotic features: Secondary | ICD-10-CM | POA: Diagnosis present

## 2021-08-11 DIAGNOSIS — E785 Hyperlipidemia, unspecified: Secondary | ICD-10-CM | POA: Diagnosis not present

## 2021-08-11 DIAGNOSIS — Z79899 Other long term (current) drug therapy: Secondary | ICD-10-CM | POA: Diagnosis not present

## 2021-08-11 DIAGNOSIS — F333 Major depressive disorder, recurrent, severe with psychotic symptoms: Secondary | ICD-10-CM | POA: Diagnosis not present

## 2021-08-11 DIAGNOSIS — R69 Illness, unspecified: Secondary | ICD-10-CM | POA: Diagnosis not present

## 2021-08-11 DIAGNOSIS — F419 Anxiety disorder, unspecified: Secondary | ICD-10-CM | POA: Diagnosis present

## 2021-08-11 DIAGNOSIS — M810 Age-related osteoporosis without current pathological fracture: Secondary | ICD-10-CM | POA: Diagnosis not present

## 2021-08-11 DIAGNOSIS — Z20822 Contact with and (suspected) exposure to covid-19: Secondary | ICD-10-CM | POA: Diagnosis present

## 2021-08-11 DIAGNOSIS — E559 Vitamin D deficiency, unspecified: Secondary | ICD-10-CM | POA: Diagnosis not present

## 2021-08-11 DIAGNOSIS — Z9151 Personal history of suicidal behavior: Secondary | ICD-10-CM

## 2021-08-11 DIAGNOSIS — I10 Essential (primary) hypertension: Secondary | ICD-10-CM | POA: Diagnosis not present

## 2021-08-11 DIAGNOSIS — F4002 Agoraphobia without panic disorder: Secondary | ICD-10-CM | POA: Diagnosis not present

## 2021-08-11 DIAGNOSIS — F4010 Social phobia, unspecified: Secondary | ICD-10-CM | POA: Diagnosis present

## 2021-08-11 MED ORDER — ALUM & MAG HYDROXIDE-SIMETH 200-200-20 MG/5ML PO SUSP: 30.0000 mL | ORAL | Status: DC | PRN

## 2021-08-11 MED ORDER — ESCITALOPRAM OXALATE 5 MG PO TABS
5.0000 mg | ORAL_TABLET | Freq: Every day | ORAL | Status: DC
  Administered 2021-08-12 – 2021-08-16 (×5): 5 mg via ORAL
  Filled 2021-08-11 (×6): qty 1

## 2021-08-11 MED ORDER — CITALOPRAM HYDROBROMIDE 10 MG PO TABS
5.0000 mg | ORAL_TABLET | Freq: Every day | ORAL | Status: DC
  Administered 2021-08-11: 5 mg via ORAL
  Filled 2021-08-11 (×3): qty 1

## 2021-08-11 MED ORDER — LORAZEPAM 1 MG PO TABS
1.0000 mg | ORAL_TABLET | Freq: Four times a day (QID) | ORAL | Status: AC | PRN
  Filled 2021-08-11: qty 1

## 2021-08-11 MED ORDER — ATORVASTATIN CALCIUM 20 MG PO TABS
20.0000 mg | ORAL_TABLET | Freq: Every day | ORAL | Status: DC
  Administered 2021-08-11 – 2021-08-24 (×14): 20 mg via ORAL
  Filled 2021-08-11 (×17): qty 1

## 2021-08-11 MED ORDER — ACETAMINOPHEN 325 MG PO TABS: 650.0000 mg | ORAL_TABLET | Freq: Four times a day (QID) | ORAL | Status: DC | PRN

## 2021-08-11 MED ORDER — ATENOLOL 50 MG PO TABS
50.0000 mg | ORAL_TABLET | Freq: Two times a day (BID) | ORAL | Status: DC
  Administered 2021-08-11 – 2021-08-24 (×21): 50 mg via ORAL
  Filled 2021-08-11 (×33): qty 1

## 2021-08-11 MED ORDER — OYSTER SHELL CALCIUM/D3 500-5 MG-MCG PO TABS
1.0000 | ORAL_TABLET | Freq: Every day | ORAL | Status: DC
  Administered 2021-08-11 – 2021-08-24 (×14): 1 via ORAL
  Filled 2021-08-11 (×16): qty 1

## 2021-08-11 MED ORDER — ADULT MULTIVITAMIN W/MINERALS CH
1.0000 | ORAL_TABLET | Freq: Every day | ORAL | Status: DC
  Administered 2021-08-11: 1 via ORAL
  Filled 2021-08-11 (×4): qty 1

## 2021-08-11 MED ORDER — BUSPIRONE HCL 15 MG PO TABS
15.0000 mg | ORAL_TABLET | Freq: Two times a day (BID) | ORAL | Status: DC
  Administered 2021-08-11: 15 mg via ORAL
  Filled 2021-08-11 (×3): qty 1

## 2021-08-11 MED ORDER — MELATONIN 3 MG PO TABS
3.0000 mg | ORAL_TABLET | Freq: Every evening | ORAL | Status: DC | PRN
  Filled 2021-08-11 (×3): qty 1

## 2021-08-11 MED ORDER — HYDROXYZINE HCL 10 MG PO TABS
10.0000 mg | ORAL_TABLET | Freq: Three times a day (TID) | ORAL | Status: DC | PRN
  Administered 2021-08-12 – 2021-08-21 (×4): 10 mg via ORAL
  Filled 2021-08-11 (×4): qty 1

## 2021-08-11 MED ORDER — LISINOPRIL 20 MG PO TABS
20.0000 mg | ORAL_TABLET | Freq: Every day | ORAL | Status: DC
  Administered 2021-08-11 – 2021-08-24 (×14): 20 mg via ORAL
  Filled 2021-08-11 (×19): qty 1

## 2021-08-11 MED ORDER — ENSURE ENLIVE PO LIQD
237.0000 mL | Freq: Two times a day (BID) | ORAL | Status: DC
  Filled 2021-08-11 (×4): qty 237

## 2021-08-11 MED ORDER — BOOST / RESOURCE BREEZE PO LIQD CUSTOM
1.0000 | ORAL | Status: DC
  Administered 2021-08-11 – 2021-08-22 (×6): 1 via ORAL
  Filled 2021-08-11 (×15): qty 1

## 2021-08-11 MED ORDER — MAGNESIUM HYDROXIDE 400 MG/5ML PO SUSP: 30.0000 mL | Freq: Every day | ORAL | Status: DC | PRN

## 2021-08-11 MED ORDER — ADULT MULTIVITAMIN W/MINERALS CH
1.0000 | ORAL_TABLET | Freq: Every day | ORAL | Status: DC
  Administered 2021-08-12 – 2021-08-24 (×13): 1 via ORAL
  Filled 2021-08-11 (×15): qty 1

## 2021-08-11 MED ORDER — AMLODIPINE BESYLATE 10 MG PO TABS
10.0000 mg | ORAL_TABLET | Freq: Every day | ORAL | Status: DC
  Administered 2021-08-11 – 2021-08-24 (×14): 10 mg via ORAL
  Filled 2021-08-11 (×17): qty 1

## 2021-08-11 NOTE — Group Note (Signed)
Recreation Therapy Group Note   Group Topic:Stress Management  Group Date: 08/11/2021 Start Time: 0930 End Time: 0949 Facilitators: Victorino Sparrow, [place] Location: [address] Dayroom   Goal Area(s) Addresses:  Patient will identify positive stress management techniques. Patient will identify benefits of using stress management post d/c.  Group Description:  Meditation.  LRT played a meditation that focused on setting boundaries with the people around you.  Meditation emphasized the point of saying no to things and the wishes of others is ok in order to put your feelings and desires first.     Affect/Mood: N/A   Participation Level: Did not attend    Clinical Observations/Individualized Feedback:     Plan: Continue to engage patient in RT group sessions 2-3x/week.   Victorino Sparrow, LRT,CTRS 08/11/2021 10:50 AM

## 2021-08-11 NOTE — BH IP Treatment Plan (Signed)
Interdisciplinary Treatment and Diagnostic Plan Update  08/11/2021 Jenna [name] MRN: [id number]  Principal Diagnosis: MDD (major depressive disorder), recurrent episode, severe ([place])  Secondary Diagnoses: Principal Problem:   MDD (major depressive disorder), recurrent episode, severe ([place])   Current Medications:  Current Facility-Administered Medications  Medication Dose Route Frequency Provider Last Rate Last Admin   acetaminophen (TYLENOL) tablet 650 mg  650 mg Oral Q6H PRN Starkes-Perry, Gayland Curry, FNP       alum & mag hydroxide-simeth (MAALOX/MYLANTA) 200-200-20 MG/5ML suspension 30 mL  30 mL Oral Q4H PRN Starkes-Perry, Gayland Curry, FNP       amLODipine (NORVASC) tablet 10 mg  10 mg Oral Daily Suella Broad, FNP   10 mg at 08/11/21 0811   atenolol (TENORMIN) tablet 50 mg  50 mg Oral BID Suella Broad, FNP   50 mg at 08/11/21 0811   atorvastatin (LIPITOR) tablet 20 mg  20 mg Oral Daily Suella Broad, FNP   20 mg at 08/11/21 0811   busPIRone (BUSPAR) tablet 15 mg  15 mg Oral BID Suella Broad, FNP   15 mg at 08/11/21 1165   calcium-vitamin D (OSCAL WITH D) 500-5 MG-MCG per tablet 1 tablet  1 tablet Oral Daily Suella Broad, FNP   1 tablet at 08/11/21 7903   lisinopril (ZESTRIL) tablet 20 mg  20 mg Oral Daily Suella Broad, FNP   20 mg at 08/11/21 8333   magnesium hydroxide (MILK OF MAGNESIA) suspension 30 mL  30 mL Oral Daily PRN Suella Broad, FNP       PTA Medications: Medications Prior to Admission  Medication Sig Dispense Refill Last Dose   acetaminophen (TYLENOL) 325 MG tablet Take 2 tablets (650 mg total) by mouth every 6 (six) hours as needed for mild pain or fever (or Fever >/= 101). (Patient not taking: Reported on 08/10/2021) 12 tablet 0    amLODipine (NORVASC) 10 MG tablet Take 1 tablet (10 mg total) by mouth daily. For BP 30 tablet 3    atenolol (TENORMIN) 50 MG tablet Take 1 tablet (50 mg total) by mouth 2 (two)  times daily. For BP 60 tablet 5    atorvastatin (LIPITOR) 20 MG tablet Take 20 mg by mouth daily.      busPIRone (BUSPAR) 15 MG tablet Take 15 mg by mouth 2 (two) times daily.      calcium-vitamin D (OSCAL WITH D) 500-200 MG-UNIT tablet Take 1 tablet by mouth daily. (Patient not taking: Reported on 08/10/2021)      clonazePAM (KLONOPIN) 0.5 MG tablet Take 0.5-1 mg by mouth at bedtime.      denosumab (PROLIA) 60 MG/ML SOSY injection 60 mg every 6 (six) months.      FLUoxetine (PROZAC) 40 MG capsule Take 40 mg by mouth every morning.      lisinopril (ZESTRIL) 20 MG tablet Take 1 tablet (20 mg total) by mouth daily. For BP (Patient taking differently: Take 20 mg by mouth in the morning. For BP) 30 tablet 11     Patient Stressors: Financial difficulties   Health problems   Marital or family conflict    Patient Strengths: Supportive family/friends   Treatment Modalities: Medication Management, Group therapy, Case management,  1 to 1 session with clinician, Psychoeducation, Recreational therapy.   Physician Treatment Plan for Primary Diagnosis: MDD (major depressive disorder), recurrent episode, severe ([place]) Long Term Goal(s):     Short Term Goals:    Medication Management: Evaluate patient's response, side  effects, and tolerance of medication regimen.  Therapeutic Interventions: 1 to 1 sessions, Unit Group sessions and Medication administration.  Evaluation of Outcomes: Not Met  Physician Treatment Plan for Secondary Diagnosis: Principal Problem:   MDD (major depressive disorder), recurrent episode, severe ([place])  Long Term Goal(s):     Short Term Goals:       Medication Management: Evaluate patient's response, side effects, and tolerance of medication regimen.  Therapeutic Interventions: 1 to 1 sessions, Unit Group sessions and Medication administration.  Evaluation of Outcomes: Not Met   RN Treatment Plan for Primary Diagnosis: MDD (major depressive disorder), recurrent  episode, severe ([place]) Long Term Goal(s): Knowledge of disease and therapeutic regimen to maintain health will improve  Short Term Goals: Ability to participate in decision making will improve, Ability to verbalize feelings will improve, and Ability to disclose and discuss suicidal ideas  Medication Management: RN will administer medications as ordered by provider, will assess and evaluate patient's response and provide education to patient for prescribed medication. RN will report any adverse and/or side effects to prescribing provider.  Therapeutic Interventions: 1 on 1 counseling sessions, Psychoeducation, Medication administration, Evaluate responses to treatment, Monitor vital signs and CBGs as ordered, Perform/monitor CIWA, COWS, AIMS and Fall Risk screenings as ordered, Perform wound care treatments as ordered.  Evaluation of Outcomes: Not Met   LCSW Treatment Plan for Primary Diagnosis: MDD (major depressive disorder), recurrent episode, severe ([place]) Long Term Goal(s): Safe transition to appropriate next level of care at discharge, Engage patient in therapeutic group addressing interpersonal concerns.  Short Term Goals: Engage patient in aftercare planning with referrals and resources, Increase social support, and Increase ability to appropriately verbalize feelings  Therapeutic Interventions: Assess for all discharge needs, 1 to 1 time with Social worker, Explore available resources and support systems, Assess for adequacy in community support network, Educate family and significant other(s) on suicide prevention, Complete Psychosocial Assessment, Interpersonal group therapy.  Evaluation of Outcomes: Not Met   Progress in Treatment: Attending groups: No. Participating in groups: No. Taking medication as prescribed: Yes. Toleration medication: Yes. Family/Significant other contact made: No, will contact:  CSW will obtain consent Patient understands diagnosis: Yes. Discussing  patient identified problems/goals with staff: Yes. Medical problems stabilized or resolved: Yes. Denies suicidal/homicidal ideation: Yes. Issues/concerns per patient self-inventory: No. Other: None  New problem(s) identified: No, Describe:  None  New Short Term/Long Term Goal(s):medication stabilization, elimination of SI thoughts, development of comprehensive mental wellness plan.   Patient Goals:  "be better"  Discharge Plan or Barriers: Patient recently admitted. CSW will continue to follow and assess for appropriate referrals and possible discharge planning.   Reason for Continuation of Hospitalization: Depression Medication stabilization Suicidal ideation  Estimated Length of Stay: 3-5 days   Scribe for Treatment Team: Eliott Nine 08/11/2021 12:23 PM

## 2021-08-11 NOTE — Progress Notes (Signed)
Pt in a stable mood. Pt spends majority of her time in the room. Minimal interaction with peers and staff. Pt states she gets anxious being around people. Denies SI/HI. Denies AVH.   Pt very forgetful. She would misplace items frequently and forget questions she meant to ask. Pt refused ensure and melatonin even though she says she is restless at night. Pt states melatonin keeps her up and the ensure upsets her stomach. Pt complained of having an episode of bowel incontinence. Staff gave items for hygiene. Pt forgot she had items already in room.   Pt denied any other complaints. Pt is cooperative. PT verbalized understanding her plan of care.
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
## 2021-08-11 NOTE — Progress Notes (Signed)
Patient did not attend the evening speaker AA meeting.  
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
## 2021-08-11 NOTE — ED Notes (Signed)
Patient discharged in route to [facility] in [employer] custody.  All belongings given to [employer].
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
## 2021-08-11 NOTE — Tx Team (Signed)
Initial Treatment Plan 08/11/2021 3:07 AM [name] KSM:[id number]    PATIENT STRESSORS: Financial difficulties   Health problems   Marital or family conflict     PATIENT STRENGTHS: Supportive family/friends    PATIENT IDENTIFIED PROBLEMS: Depression  Anxiety  "Anger management"  " Go back to whole self"               DISCHARGE CRITERIA:  Ability to meet basic life and health needs Improved stabilization in mood, thinking, and/or behavior Verbal commitment to aftercare and medication compliance  PRELIMINARY DISCHARGE PLAN: Attend PHP/IOP Outpatient therapy Return to previous living arrangement  PATIENT/FAMILY INVOLVEMENT: This treatment plan has been presented to and reviewed with the patient, Jenna [name], and/or family member. The patient and family have been given the opportunity to ask questions and make suggestions.  Wolfgang Phoenix, RN 08/11/2021, 3:07 AM

## 2021-08-11 NOTE — Group Note (Signed)
LCSW Group Therapy Note   Group Date: 08/11/2021 Start Time: 1300 End Time: 1400   Topic: Coping Skills  Due to the acuity and a high number of new admissions, group was not held. Patient was provided therapeutic worksheets and asked to meet with CSW as needed.   [name], LCSWA 08/11/2021  12:53 PM

## 2021-08-11 NOTE — H&P (Addendum)
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 to try Lexapro in place of Celexa given FDA black box warnings. Will attempt medication trial for depression but may need to consider ECT or [place] for patient. Patient did well on DOWB and recall was 2/3 and patient was able to get the last word with a category cue decreasing concern that patient's presentation is 2/2 Major Neurocognitive impairment. Patient continues to display poor insight as Jenna [name] does not believe Jenna [name] needs treatment in the hospital, but is complaint thus far.   Labs Reviewed: TSH- WNL, UDS (+ Benzo's), A1c- 5.4, Lipid panel- WNL, Salicylate (-), Acetaminophen (-), CBC- WNL, EtoH <10, CMP- WNL  w/ exception Ca 8.1 and T protein 6.1 and anion gap 4  EKG - QTC 418 sinus brady  MDD, recurrent severe w/o psychosis (r/o pseudodementia vs Mild Neurocognitive disorder) - Continue IVC - Start Lexapro 66m, titrate up as tolerated (r/b/se/a to med discussed and Jenna [name] consents to med trial - May need to consider ECT - UA pending - Add Resource and Ensure as well as MVI daily po as nutritional supplement - Consider addition of Remeron for help with appetite stimulant and sleep - Melatonin 338mpo qhs PRN insomnia - Vistaril 1065mid PRN anxiety - Will order folate, RPR, ESR, and B12 for  dementia w/u given cognitive concerns  - Will place on CIWA for possible benzodiazepine withdrawal and not restarting home Klonopin at this time  HTN,hx - Cont home amlodipine 21m13mCont home atenolol 50mg19m - Lisinopril 20mg 86mD - Lipitor 20mg  49m-Tylenol 650mg q626main -Maalox 30ml q4h8mdigestion -Milk of Mag 30mL, con28mation -Hydroxyzine 21mg TID P81mSafety: Q 15 min checks, patient appears to be of low risk of harm to self in this current setting  Physician Treatment Plan for Primary Diagnosis: MDD (major depressive disorder), recurrent episode, severe ([place]) Long Term Goal(s): Improvement in symptoms so as ready for discharge  Short Term Goals: Ability to identify changes in lifestyle to reduce recurrence of condition will improve, Ability to verbalize feelings will improve, Ability to disclose and discuss suicidal ideas, Ability to demonstrate self-control will improve, Ability to identify and develop effective coping behaviors will improve, Ability to maintain clinical measurements within normal limits will improve, Compliance with prescribed medications will improve, and Ability to identify triggers associated with substance abuse/mental health issues will improve  Physician Treatment Plan for Secondary Diagnosis: Principal Problem:   MDD (major depressive disorder), recurrent episode, severe ([place])  Long Term Goal(s): Improvement in symptoms so as ready for discharge  Short Term Goals: Ability to identify changes in lifestyle to reduce recurrence of condition will improve, Ability to verbalize feelings will improve, Ability to disclose and discuss suicidal ideas, Ability to demonstrate self-control will improve, Ability to identify and develop effective coping behaviors will improve, Ability to maintain clinical measurements within normal limits will improve, Compliance with prescribed medications will improve, and Ability to identify triggers associated with  substance abuse/mental health issues will improve  I certify that inpatient services furnished can reasonably be expected to improve the patient's condition.     PGY-2 Freida Busman, MD 12/9/20223:52 PM
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
## 2021-08-11 NOTE — Progress Notes (Signed)
Pt denies SI/HI/AVH and verbally agrees to approach staff if these become apparent or before harming themselves/others. Rates depression 10/10. Rates anxiety 10/10. Rates pain 0/10. Pt stated that she was "scared of people" when RN asked if pt was having any AVH. Pt has been isolative in room but did go down to lunch and stated that she did eat. Pt stated that she did not want to be here and that this is not the place for her. Pt stated "I need to go to a medical hospital." RN redirected pt. Pt did get tearful at one point during assessment and was slightly irritable when RN was asking questions. Pt on another encounter,  was more pleasant and appreciative but still seemed sad. Scheduled medications administered to Pt, per MD orders. RN provided support and encouragement to Pt. Q15 min safety checks implemented and continued. Pt safe on the unit. RN will continue to monitor and intervene as needed.   08/11/21 0811  Psych Admission Type (Psych Patients Only)  Admission Status Involuntary  Psychosocial Assessment  Patient Complaints Anxiety;Depression;Restlessness  Eye Contact Avoids  Facial Expression Anxious;Sad;Sullen;Angry  Affect Anxious;Depressed;Frightened  Speech Soft;Logical/coherent  Interaction Guarded;Isolative;Forwards little  Motor Activity Slow  Appearance/Hygiene Unremarkable  Behavior Characteristics Anxious;Guarded;Irritable  Mood Depressed;Anxious;Sullen;Fearful  Thought Process  Coherency WDL  Content Blaming others  Delusions None reported or observed  Perception WDL  Hallucination None reported or observed  Judgment Poor  Confusion None  Danger to Self  Current suicidal ideation? Denies  Danger to Others  Danger to Others None reported or observed

## 2021-08-12 LAB — URINALYSIS, COMPLETE (UACMP) WITH MICROSCOPIC
Bilirubin Urine: NEGATIVE
Glucose, UA: NEGATIVE mg/dL
Hgb urine dipstick: NEGATIVE
Ketones, ur: NEGATIVE mg/dL
Nitrite: NEGATIVE
Protein, ur: NEGATIVE mg/dL
Specific Gravity, Urine: 1.014 (ref 1.005–1.030)
pH: 7 (ref 5.0–8.0)

## 2021-08-12 LAB — FOLATE: Folate: 21.7 ng/mL (ref >5.9)

## 2021-08-12 LAB — BASIC METABOLIC PANEL
Anion gap: 6 (ref 5–15)
BUN: 17 mg/dL (ref 8–23)
CO2: 27 mmol/L (ref 22–32)
Calcium: 9.1 mg/dL (ref 8.9–10.3)
Chloride: 106 mmol/L (ref 98–111)
Creatinine, Ser: 0.72 mg/dL (ref 0.44–1.00)
GFR, Estimated: >60 mL/min (ref >60)
Glucose, Bld: 122 mg/dL — ABNORMAL HIGH (ref 70–99)
Potassium: 3.5 mmol/L (ref 3.5–5.1)
Sodium: 139 mmol/L (ref 135–145)

## 2021-08-12 LAB — SEDIMENTATION RATE: Sed Rate: 11 mm/hr (ref 0–22)

## 2021-08-12 LAB — VITAMIN B12: Vitamin B-12: 327 pg/mL (ref 180–914)

## 2021-08-12 MED ORDER — MIRTAZAPINE 7.5 MG PO TABS
7.5000 mg | ORAL_TABLET | Freq: Every day | ORAL | Status: DC
  Filled 2021-08-12 (×3): qty 1

## 2021-08-12 NOTE — Progress Notes (Signed)
   08/12/21 2228  Psych Admission Type (Psych Patients Only)  Admission Status Involuntary  Psychosocial Assessment  Patient Complaints None  Eye Contact Brief  Facial Expression Flat  Affect Appropriate to circumstance  Speech UTA  Interaction Minimal  Motor Activity Slow  Appearance/Hygiene Unremarkable  Behavior Characteristics Anxious (sleeping)  Mood Anxious (sleeping)  Thought Process  Coherency WDL  Content WDL  Delusions None reported or observed  Perception WDL  Hallucination None reported or observed  Judgment Impaired  Confusion None  Danger to Self  Current suicidal ideation? Denies  Danger to Others  Danger to Others None reported or observed

## 2021-08-12 NOTE — BHH Group Notes (Signed)
.  Psychoeducational Group Note    Date:08/12/2021 Time: 1300-1400    Purpose of Group: . The group focus' on teaching patients on how to identify their needs and their Life Skills:  A group where two lists are made. What people need and what are things that we do that are unhealthy. The lists are developed by the patients and it is explained that we often do the actions that are not healthy to get our list of needs met.  Goal:: to develop the coping skills needed to get their needs met  Participation Level:  Did not attend  Ahmya Bernick A  

## 2021-08-12 NOTE — BHH Group Notes (Signed)
Pt did not attend wrap up group this evening. Pt was in the bed sleeping.

## 2021-08-12 NOTE — Progress Notes (Signed)
   08/12/21 1200  Psych Admission Type (Psych Patients Only)  Admission Status Involuntary  Psychosocial Assessment  Patient Complaints Anxiety;Suspiciousness  Eye Contact Fair  Facial Expression Flat  Affect Anxious  Speech Soft;Logical/coherent  Interaction Minimal  Motor Activity Slow  Appearance/Hygiene Unremarkable  Behavior Characteristics Anxious  Mood Anxious  Thought Process  Coherency WDL  Content WDL  Delusions None reported or observed  Perception WDL  Hallucination None reported or observed  Judgment Impaired  Confusion None  Danger to Self  Current suicidal ideation? Denies  Danger to Others  Danger to Others None reported or observed

## 2021-08-12 NOTE — Progress Notes (Addendum)
[facility] MD Progress Note  08/12/2021 11:20 AM Jenna [name]  MRN:  [id number] Subjective:  In short : Jenna [name] is a 70 yo patient w/ PPH of MDD who presented to [facility] under IVC and was transferred to [facility] after SI.   Chart review from last 24 hours-The patient's chart was reviewed and nursing notes were reviewed. Patient discussed in progression rounds with treatment team. MAR was reviewed and Pt is complaint with scheduled medications except Ensure due to diarrhea and did not require required any PRN medications.  Last CIWA 0.  Nursing staff reported that patient is quiet, minimal, anxious being around people, forgetful and isolating in her room.   Information Obtained Today During Patient Interview: Pt is seen and examined today. Pt states she is doing ok and wants to go home.  Discussed that she is under IVC and cannot leave voluntarily. Pt states she did not sleep well last night.  Discussed adding Remeron to help with sleep.  Patient is anxious that she is on multiple medication and does not want to add another medication.  Discussed that she is only on 1 psychiatric medication Lexapro . She agreed on starting Remeron to help with sleep.  Discussed the nursing staff has reported that she is quiet, interacting minimal and isolating in her room.  She states that she feels anxious and does not like crowds, does not want to go out and talk to people who she does not know. Encouraged to attend groups, and going out in the dayroom and cafeteria for meals. She states she is planning to go back and stay with her husband after discharge.  Discussed that her husband has communicated to staff that he is thinking about divorce and she is evasive when attempting to discuss this today. She states she talked to him recently and he has not communicated to her about that.  She states she has family and children here in [place] but she cannot live with them.  She states her memory is not so good.  Discussed that  poor memory can be due to depression.  Pt states her appetite is ok.   She states she had diarrhea yesterday due to Ensure and had an accident in the bathroom. She refused Ensure and reports that her diarrhea is better today.  Currently, Pt denies any suicidal ideation, homicidal ideation and, visual and auditory hallucination. She denies paranoia. Pt denies any headache, nausea, vomiting, dizziness, chest pain, SOB, abdominal pain, and constipation. Pt denies any medication side effects and has been tolerating it well. Pt denies any concerns.    Principal Problem: MDD (major depressive disorder), recurrent episode, severe ([place]) Diagnosis: Principal Problem:   MDD (major depressive disorder), recurrent episode, severe ([place])  Total Time spent with patient: 30 minutes  Past Psychiatric History: see h&P  Past Medical History:  Past Medical History:  Diagnosis Date   Anxiety    Hypertension    Hyponatremia    History reviewed. No pertinent surgical history. Family History:  Family History  Problem Relation Age of Onset   Hypertension Mother    Family Psychiatric  History: see h&P Social History:  Social History   Substance and Sexual Activity  Alcohol Use Not Currently   Alcohol/week: 0.0 standard drinks     Social History   Substance and Sexual Activity  Drug Use Not Currently    Social History   Socioeconomic History   Marital status: Married    Spouse name: Not on file  Number of children: Not on file   Years of education: Not on file   Highest education level: Not on file  Occupational History   Not on file  Tobacco Use   Smoking status: Never   Smokeless tobacco: Never  Substance and Sexual Activity   Alcohol use: Not Currently    Alcohol/week: 0.0 standard drinks   Drug use: Not Currently   Sexual activity: Yes  Other Topics Concern   Not on file  Social History Narrative   Not on file   Social Determinants of Health   Financial Resource Strain: Not on  file  Food Insecurity: Not on file  Transportation Needs: Not on file  Physical Activity: Not on file  Stress: Not on file  Social Connections: Not on file   Sleep: Poor  Appetite:  Poor  Current Medications: Current Facility-Administered Medications  Medication Dose Route Frequency Provider Last Rate Last Admin   acetaminophen (TYLENOL) tablet 650 mg  650 mg Oral Q6H PRN Starkes-Perry, Gayland Curry, FNP       alum & mag hydroxide-simeth (MAALOX/MYLANTA) 200-200-20 MG/5ML suspension 30 mL  30 mL Oral Q4H PRN Starkes-Perry, Gayland Curry, FNP       amLODipine (NORVASC) tablet 10 mg  10 mg Oral Daily Suella Broad, FNP   10 mg at 08/12/21 0753   atenolol (TENORMIN) tablet 50 mg  50 mg Oral BID Suella Broad, FNP   50 mg at 08/12/21 0753   atorvastatin (LIPITOR) tablet 20 mg  20 mg Oral Daily Suella Broad, FNP   20 mg at 08/12/21 0753   calcium-vitamin D (OSCAL WITH D) 500-5 MG-MCG per tablet 1 tablet  1 tablet Oral Daily Suella Broad, FNP   1 tablet at 08/12/21 0753   escitalopram (LEXAPRO) tablet 5 mg  5 mg Oral Daily Damita Dunnings B, MD   5 mg at 08/12/21 0753   feeding supplement (BOOST / RESOURCE BREEZE) liquid 1 Container  1 Container Oral Q24H Massengill, Ovid Curd, MD   1 Container at 08/11/21 1456   feeding supplement (ENSURE ENLIVE / ENSURE PLUS) liquid 237 mL  237 mL Oral BID BM Massengill, Ovid Curd, MD       hydrOXYzine (ATARAX) tablet 10 mg  10 mg Oral TID PRN Freida Busman, MD       lisinopril (ZESTRIL) tablet 20 mg  20 mg Oral Daily Suella Broad, FNP   20 mg at 08/12/21 0753   LORazepam (ATIVAN) tablet 1 mg  1 mg Oral Q6H PRN Harlow Asa, MD       magnesium hydroxide (MILK OF MAGNESIA) suspension 30 mL  30 mL Oral Daily PRN Starkes-Perry, Gayland Curry, FNP       melatonin tablet 3 mg  3 mg Oral QHS PRN Nelda Marseille, Royalti Schauf E, MD       mirtazapine (REMERON) tablet 7.5 mg  7.5 mg Oral QHS Armando Reichert, MD       multivitamin with minerals tablet 1  tablet  1 tablet Oral Daily Harlow Asa, MD   1 tablet at 08/12/21 6160    Lab Results:  Results for orders placed or performed during the hospital encounter of 08/11/21 (from the past 48 hour(s))  Vitamin B12     Status: None   Collection Time: 08/12/21  6:36 AM  Result Value Ref Range   Vitamin B-12 327 180 - 914 pg/mL    Comment: (NOTE) This assay is not validated for testing neonatal or myeloproliferative syndrome  specimens for Vitamin B12 levels. Performed at [facility], [place] [address] [place], [place] [address]   Folate, serum, performed at [facility] lab     Status: None   Collection Time: 08/12/21  6:36 AM  Result Value Ref Range   Folate 21.7 >5.9 ng/mL    Comment: Performed at [facility], [place] [address], [place], [place] [address]  Sedimentation rate     Status: None   Collection Time: 08/12/21  6:36 AM  Result Value Ref Range   Sed Rate 11 0 - 22 mm/hr    Comment: Performed at [facility], [place] [address], [place], [place] [address]    Blood Alcohol level:  Lab Results  Component Value Date   ETH <10 99/37/1696    Metabolic Disorder Labs: Lab Results  Component Value Date   HGBA1C 5.4 08/10/2021   MPG 108.28 08/10/2021   No results found for: PROLACTIN Lab Results  Component Value Date   CHOL 141 08/10/2021   TRIG 82 08/10/2021   HDL 59 08/10/2021   CHOLHDL 2.4 08/10/2021   VLDL 16 08/10/2021   LDLCALC 66 08/10/2021    Physical Findings: AIMS: Facial and Oral Movements Muscles of Facial Expression: None, normal Lips and Perioral Area: None, normal Jaw: None, normal Tongue: None, normal,Extremity Movements Upper (arms, wrists, hands, fingers): None, normal Lower (legs, knees, ankles, toes): None, normal, Trunk Movements Neck, shoulders, hips: None, normal, Overall Severity Severity of abnormal movements (highest score from questions above): None, normal Incapacitation due to  abnormal movements: None, normal Patient's awareness of abnormal movements (rate only patient's report): No Awareness, Dental Status Current problems with teeth and/or dentures?: No Does patient usually wear dentures?: No  CIWA:  CIWA-Ar Total: 0 COWS:  COWS Total Score: 0  Musculoskeletal: Strength & Muscle Tone: within normal limits Gait & Station: normal Patient leans: N/A  Psychiatric Specialty Exam:  Presentation  General Appearance: Appropriate for Environment, casually dressed, petite and thin  Eye Contact:Fair  Speech:Clear and Coherent  Speech Volume:Decreased  Handedness:No data recorded  Mood and Affect  Mood:Depressed; Anxious  Affect:Flat   Thought Process  Thought Processes:Linear but evasive and avoidant  Descriptions of Associations:intact  Orientation:Full (Time, Place and Person)  Thought Content:Logical - denies AVH, paranoia, or delusions  History of Schizophrenia/Schizoaffective disorder:No  Duration of Psychotic Symptoms:NA Hallucinations:Hallucinations: None  Ideas of Reference:None  Suicidal Thoughts:Suicidal Thoughts: No  Homicidal Thoughts:Homicidal Thoughts: No   Sensorium  Memory:Immediate Good; Recent Good  Judgment:Impaired  Insight:Lacking   Executive Functions  Concentration:Fair  Attention Span:Fair  [place] of Knowledge:Good  Language:Good   Psychomotor Activity  Psychomotor Activity:Psychomotor Activity: Psychomotor Retardation; Decreased   Assets  Assets:Communication Skills; Social Support; Resilience   Sleep  Time unrecorded  Physical Exam Vitals and nursing note reviewed.  Constitutional:      General: She is not in acute distress.    Appearance: Normal appearance. She is not ill-appearing, toxic-appearing or diaphoretic.  Pulmonary:     Effort: Pulmonary effort is normal.  Neurological:     General: No focal deficit present.     Mental Status: She is alert and oriented to  person, place, and time.   Review of Systems  Constitutional:  Negative for chills and fever.  HENT:  Negative for hearing loss.   Eyes:  Negative for blurred vision.  Respiratory:  Negative for cough and shortness of breath.   Cardiovascular:  Negative for chest pain.  Gastrointestinal:  Positive for  diarrhea. Negative for nausea and vomiting.       Yesterday had diarrhea  Genitourinary:  Negative for dysuria.  Neurological:  Negative for dizziness, focal weakness and headaches.  Psychiatric/Behavioral:  Positive for depression and memory loss. Negative for hallucinations and suicidal ideas. The patient is nervous/anxious.   Blood pressure 124/78, pulse (!) 57, temperature 97.8 F (36.6 C), resp. rate 16, height _0  (1.499 m), weight 38.6 kg, SpO2 100 %. Body mass index is 17.21 kg/m.   Treatment Plan Summary:Jenna [name] is a 70 yo patient w/ PPH of MDD who presented to [facility] under IVC and was transferred to [facility] after SI.  Daily contact with patient to assess and evaluate symptoms and progress in treatment   New labs: Pending labs -  RPR for dementia w/u given cognitive concerns , UA  MDD, recurrent severe w/o psychosis (r/o pseudodementia vs Mild Neurocognitive disorder) - Continue IVC -Continue Lexapro 12m, titrate up as tolerated (r/b/se/a to med discussed and she consents to med trial - checking BMP given h/o hyponatremia on past SSRI - May need to consider ECT - UA pending-collected - Stop Ensure due to diarrhea - Start Remeron 7.5 mg nightly to help with appetite stimulant and sleep (r/b/se/a to med discussed and she consents to med trial) - Melatonin 359mpo qhs PRN insomnia - Vistaril 1023mid PRN anxiety - ESR 11, B12 327, folate 21.7, RPR pending and UA pending  Possible benzodiazepine withdrawal -Continue CIWA with as needed Ativan for possible benzodiazepine withdrawal. not restarting home Klonopin at this time   HTN,hx - Cont home amlodipine 62m49mCont home  atenolol 50mg42m - Lisinopril 20mg 23mLD - Lipitor 20mg  53m -Tylenol 650mg q658main -Maalox 30ml q4h31mdigestion -Milk of Mag 30mL, con60mation -Hydroxyzine 62mg TID P57m Vandana  DoArmando Reichert12/06/2021, 11:20 AM

## 2021-08-12 NOTE — BHH Group Notes (Signed)
Goals Group 08/12/21    Group Focus: affirmation, clarity of thought, and goals/reality orientation Treatment Modality:  Psychoeducation Interventions utilized were assignment, group exercise, and support Purpose: To be able to understand and verbalize the reason for their admission to the hospital. To understand that the medication helps with their chemical imbalance but they also need to work on their choices in life. To be challenged to develop a list of 30 positives about themselves. Also introduce the concept that "feelings" are not reality.  Participation Level:  Did not attend Jenna [name]
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
## 2021-08-12 NOTE — Group Note (Signed)
LCSW Group Therapy Note  08/12/2021    10:00-11:00am   Type of Therapy and Topic:  Group Therapy: Early Messages Received About Anger  Participation Level:  Did Not Attend   Description of Group:   In this group, patients shared and discussed the early messages received in their lives about anger through parental or other adult modeling, teaching, repression, punishment, violence, and more.  Participants identified how those childhood lessons influence even now how they usually or often react when angered.  The group discussed that anger is a secondary emotion and what may be the underlying emotional themes that come out through anger outbursts or that are ignored through anger suppression.    Therapeutic Goals: Patients will identify one or more childhood message about anger that they received and how it was taught to them. Patients will discuss how these childhood experiences have influenced and continue to influence their own expression or repression of anger even today. Patients will explore possible primary emotions that tend to fuel their secondary emotion of anger. Patients will learn that anger itself is normal and cannot be eliminated, and that healthier coping skills can assist with resolving conflict rather than worsening situations.  Summary of Patient Progress:  The patient did not attend this group.  Therapeutic Modalities:   Cognitive Behavioral Therapy Motivation Interviewing  [place], [place] 08/12/2021 11:21 AM

## 2021-08-13 LAB — RPR: RPR Ser Ql: NONREACTIVE

## 2021-08-13 MED ORDER — HYDROXYZINE HCL 25 MG PO TABS
25.0000 mg | ORAL_TABLET | Freq: Once | ORAL | Status: AC
  Administered 2021-08-13: 25 mg via ORAL
  Filled 2021-08-13 (×2): qty 1

## 2021-08-13 NOTE — Progress Notes (Signed)
[facility] MD Progress Note  08/13/2021 10:38 AM Jenna [name]  MRN:  [id number] Subjective:  In short : Jenna [name] is a 70 yo patient w/ PPH of MDD who presented to [facility] under IVC and was transferred to [facility] after SI.   Chart review from last 24 hours-The patient's chart was reviewed and nursing notes were reviewed. Patient discussed in progression rounds with treatment team. MAR was reviewed and Pt is complaint with scheduled medications except Remeron which was not given last night as Pt was sleeping. She required PRN Vistaril yesterday.  Last CIWA 0.  Nursing staff reported that patient was isolative, did not attend any groups but going to cafeteria for meals.    Information Obtained Today During Patient Interview: Pt is seen and examined today. Pt states she is doing ok and wants to go home soon.  Patient states her mood is still "sad" and rates her depression at 9/10 and anxiety at 9/10 on a scale of 1-10 where 10 being severe symptoms.  Pt states she did not sleep well last night and woke up twice. She states that she attended 1 group but she  feels anxious around people. Encouraged to attend groups, go out in the dayroom and keep on going to cafeteria for meals.  She states that she cannot attend groups and talk to other people as she feels anxious.  She states she talked to her husband and daughter yesterday and talked about getting out of here.  She states that she is sure that her husband will let her come to the house.  Pt states her appetite is fair and she is eating all meals but only small portions.   She denies any diarrhea today.  Currently, Pt denies any suicidal ideation, homicidal ideation and, visual and auditory hallucination. She denies paranoia. Pt denies any headache, nausea, vomiting, dizziness, chest pain, SOB, abdominal pain, and constipation.  Discussed the results of her urine analysis.  She denies any urinary symptoms.  She states she has chronic pain in her flanks.  She denies  any history of kidney stones.  Pt denies any medication side effects and has been tolerating it well. Pt denies any concerns.    Principal Problem: MDD (major depressive disorder), recurrent episode, severe ([place]) Diagnosis: Principal Problem:   MDD (major depressive disorder), recurrent episode, severe ([place])  Total Time spent with patient: 30 minutes  Past Psychiatric History: see h&P  Past Medical History:  Past Medical History:  Diagnosis Date   Anxiety    Hypertension    Hyponatremia    History reviewed. No pertinent surgical history. Family History:  Family History  Problem Relation Age of Onset   Hypertension Mother    Family Psychiatric  History: see h&P Social History:  Social History   Substance and Sexual Activity  Alcohol Use Not Currently   Alcohol/week: 0.0 standard drinks     Social History   Substance and Sexual Activity  Drug Use Not Currently    Social History   Socioeconomic History   Marital status: Married    Spouse name: Not on file   Number of children: Not on file   Years of education: Not on file   Highest education level: Not on file  Occupational History   Not on file  Tobacco Use   Smoking status: Never   Smokeless tobacco: Never  Substance and Sexual Activity   Alcohol use: Not Currently    Alcohol/week: 0.0 standard drinks   Drug use: Not  Currently   Sexual activity: Yes  Other Topics Concern   Not on file  Social History Narrative   Not on file   Social Determinants of Health   Financial Resource Strain: Not on file  Food Insecurity: Not on file  Transportation Needs: Not on file  Physical Activity: Not on file  Stress: Not on file  Social Connections: Not on file   Sleep: Fair  Appetite:  Fair, eating all meals but in small portions  Current Medications: Current Facility-Administered Medications  Medication Dose Route Frequency Provider Last Rate Last Admin   acetaminophen (TYLENOL) tablet 650 mg  650 mg Oral Q6H  PRN Starkes-Perry, Gayland Curry, FNP       alum & mag hydroxide-simeth (MAALOX/MYLANTA) 200-200-20 MG/5ML suspension 30 mL  30 mL Oral Q4H PRN Starkes-Perry, Gayland Curry, FNP       amLODipine (NORVASC) tablet 10 mg  10 mg Oral Daily Suella Broad, FNP   10 mg at 08/13/21 0929   atenolol (TENORMIN) tablet 50 mg  50 mg Oral BID Suella Broad, FNP   50 mg at 08/13/21 0930   atorvastatin (LIPITOR) tablet 20 mg  20 mg Oral Daily Suella Broad, FNP   20 mg at 08/13/21 0930   calcium-vitamin D (OSCAL WITH D) 500-5 MG-MCG per tablet 1 tablet  1 tablet Oral Daily Suella Broad, FNP   1 tablet at 08/13/21 0930   escitalopram (LEXAPRO) tablet 5 mg  5 mg Oral Daily Damita Dunnings B, MD   5 mg at 08/13/21 0930   feeding supplement (BOOST / RESOURCE BREEZE) liquid 1 Container  1 Container Oral Q24H Massengill, Ovid Curd, MD   1 Container at 08/12/21 1201   hydrOXYzine (ATARAX) tablet 10 mg  10 mg Oral TID PRN Damita Dunnings B, MD   10 mg at 08/12/21 1356   lisinopril (ZESTRIL) tablet 20 mg  20 mg Oral Daily Suella Broad, FNP   20 mg at 08/13/21 0929   LORazepam (ATIVAN) tablet 1 mg  1 mg Oral Q6H PRN Harlow Asa, MD       magnesium hydroxide (MILK OF MAGNESIA) suspension 30 mL  30 mL Oral Daily PRN Starkes-Perry, Gayland Curry, FNP       melatonin tablet 3 mg  3 mg Oral QHS PRN Nelda Marseille, Amy E, MD       mirtazapine (REMERON) tablet 7.5 mg  7.5 mg Oral QHS Armando Reichert, MD       multivitamin with minerals tablet 1 tablet  1 tablet Oral Daily Viann Fish E, MD   1 tablet at 08/13/21 0930    Lab Results:  Results for orders placed or performed during the hospital encounter of 08/11/21 (from the past 48 hour(s))  Vitamin B12     Status: None   Collection Time: 08/12/21  6:36 AM  Result Value Ref Range   Vitamin B-12 327 180 - 914 pg/mL    Comment: (NOTE) This assay is not validated for testing neonatal or myeloproliferative syndrome specimens for Vitamin B12  levels. Performed at [facility], [place] [address] [place], [place] [address]   Folate, serum, performed at [facility] lab     Status: None   Collection Time: 08/12/21  6:36 AM  Result Value Ref Range   Folate 21.7 >5.9 ng/mL    Comment: Performed at [facility], [place] [address] [place], [place] [address]  Sedimentation rate     Status: None   Collection  Time: 08/12/21  6:36 AM  Result Value Ref Range   Sed Rate 11 0 - 22 mm/hr    Comment: Performed at [facility], [place] [address] [place], [place] [address]  Urinalysis, Complete w Microscopic Urine, Clean Catch     Status: Abnormal   Collection Time: 08/12/21 11:07 AM  Result Value Ref Range   Color, Urine YELLOW YELLOW   APPearance CLOUDY (A) CLEAR   Specific Gravity, Urine 1.014 1.005 - 1.030   pH 7.0 5.0 - 8.0   Glucose, UA NEGATIVE NEGATIVE mg/dL   Hgb urine dipstick NEGATIVE NEGATIVE   Bilirubin Urine NEGATIVE NEGATIVE   Ketones, ur NEGATIVE NEGATIVE mg/dL   Protein, ur NEGATIVE NEGATIVE mg/dL   Nitrite NEGATIVE NEGATIVE   Leukocytes,Ua MODERATE (A) NEGATIVE   RBC / HPF 0-5 0 - 5 RBC/hpf   WBC, UA 0-5 0 - 5 WBC/hpf   Bacteria, UA RARE (A) NONE SEEN   Squamous Epithelial / LPF 0-5 0 - 5   Mucus PRESENT     Comment: Performed at [facility], [place] [address], [place], [place] [address]  Basic metabolic panel     Status: Abnormal   Collection Time: 08/12/21  6:32 PM  Result Value Ref Range   Sodium 139 135 - 145 mmol/L   Potassium 3.5 3.5 - 5.1 mmol/L   Chloride 106 98 - 111 mmol/L   CO2 27 22 - 32 mmol/L   Glucose, Bld 122 (H) 70 - 99 mg/dL    Comment: Glucose reference range applies only to samples taken after fasting for at least 8 hours.   BUN 17 8 - 23 mg/dL   Creatinine, Ser 0.72 0.44 - 1.00 mg/dL   Calcium 9.1 8.9 - 10.3 mg/dL   GFR, Estimated >60 >60 mL/min    Comment: (NOTE) Calculated using the CKD-EPI Creatinine Equation (2021)     Anion gap 6 5 - 15    Comment: Performed at [facility], [place] [address] [place], [place] [address]    Blood Alcohol level:  Lab Results  Component Value Date   ETH <10 19/16/6060    Metabolic Disorder Labs: Lab Results  Component Value Date   HGBA1C 5.4 08/10/2021   MPG 108.28 08/10/2021   No results found for: PROLACTIN Lab Results  Component Value Date   CHOL 141 08/10/2021   TRIG 82 08/10/2021   HDL 59 08/10/2021   CHOLHDL 2.4 08/10/2021   VLDL 16 08/10/2021   LDLCALC 66 08/10/2021    Physical Findings: AIMS: Facial and Oral Movements Muscles of Facial Expression: None, normal Lips and Perioral Area: None, normal Jaw: None, normal Tongue: None, normal,Extremity Movements Upper (arms, wrists, hands, fingers): None, normal Lower (legs, knees, ankles, toes): None, normal, Trunk Movements Neck, shoulders, hips: None, normal, Overall Severity Severity of abnormal movements (highest score from questions above): None, normal Incapacitation due to abnormal movements: None, normal Patient's awareness of abnormal movements (rate only patient's report): No Awareness, Dental Status Current problems with teeth and/or dentures?: No Does patient usually wear dentures?: No  CIWA:  CIWA-Ar Total: 0 COWS:  COWS Total Score: 1  Musculoskeletal: Strength & Muscle Tone: within normal limits Gait & Station: normal Patient leans: N/A  Psychiatric Specialty Exam:  Presentation  General Appearance: Appropriate for Environment; Casual , casually dressed, petite and thin  Eye Contact:Fair  Speech:Clear and Coherent  Speech Volume:Decreased  Handedness:No data recorded  Mood and Affect  Mood:Depressed; Anxious  Affect:Blunt  Thought Process  Thought Processes:Linear  but evasive and avoidant  Descriptions of Associations:intact  Orientation:Full (Time, Place and Person)  Thought Content:Logical  - denies AVH, paranoia, or  delusions  History of Schizophrenia/Schizoaffective disorder:No  Duration of Psychotic Symptoms:NA Hallucinations:Hallucinations: None  Ideas of Reference:None  Suicidal Thoughts:Suicidal Thoughts: No  Homicidal Thoughts:Homicidal Thoughts: No   Sensorium  Memory:Immediate Good; Recent Good  Judgment:Impaired  Insight:Lacking   Executive Functions  Concentration:Fair  Attention Span:Fair  [place] of Knowledge:Good  Language:Good   Psychomotor Activity  Psychomotor Activity:Psychomotor Activity: Decreased   Assets  Assets:Communication Skills; Social Support; Resilience   Sleep  Time unrecorded  Physical Exam Vitals and nursing note reviewed.  Constitutional:      General: She is not in acute distress.    Appearance: Normal appearance. She is not ill-appearing, toxic-appearing or diaphoretic.  Pulmonary:     Effort: Pulmonary effort is normal.  Neurological:     General: No focal deficit present.     Mental Status: She is alert and oriented to person, place, and time.   Review of Systems  Constitutional:  Negative for chills and fever.  HENT:  Negative for hearing loss.   Eyes:  Negative for blurred vision.  Respiratory:  Negative for cough and shortness of breath.   Cardiovascular:  Negative for chest pain.  Gastrointestinal:  Negative for diarrhea, nausea and vomiting.  Genitourinary:  Positive for flank pain. Negative for dysuria, frequency, hematuria and urgency.       Complains of chronic bilateral flank pain  Neurological:  Negative for dizziness, focal weakness and headaches.  Psychiatric/Behavioral:  Positive for depression. Negative for hallucinations and suicidal ideas. The patient is nervous/anxious.   Blood pressure 118/75, pulse 60, temperature 98.4 F (36.9 C), resp. rate 16, height '4\' 11"'  (1.499 m), weight 38.6 kg, SpO2 100 %. Body mass index is 17.21 kg/m.   Treatment Plan Summary:Malyna Nordgren is a 70 yo patient w/ PPH  of MDD who presented to [facility] under IVC and was transferred to [facility] after SI.  Daily contact with patient to assess and evaluate symptoms and progress in treatment   New labs: Urinalysis-cloudy, moderate leukocytes with rare bacteria,. BMP-within normal limits, glucose 122 Pending labs -  RPR for dementia w/u given cognitive concerns , UA Will send urine culture.  MDD, recurrent severe w/o psychosis (r/o pseudodementia vs Mild Neurocognitive disorder) - Continue IVC -Continue Lexapro 81m, titrate up as tolerated (r/b/se/a to med discussed and she consents to med trial -BMP-normal. - May need to consider ECT -Continue Remeron 7.5 mg nightly to help with appetite stimulant and sleep (r/b/se/a to med discussed and she consents to med trial) - Melatonin 3 mg po qhs PRN insomnia - Vistaril 165mtid PRN anxiety - ESR 11, B12 327, folate 21.7, UA shows moderate leukocytes with rare bacteria . Urine culture added to previous collection RPR -pending  Possible benzodiazepine withdrawal Recent CIWA 0 -Continue CIWA with as needed Ativan for possible benzodiazepine withdrawal. not restarting home Klonopin at this time.     HTN,hx - Cont home amlodipine 1057m Cont home atenolol 64m22mD - Lisinopril 20mg28mLD - Lipitor 20mg 65m? /Chronic bilateral flank pain Urinalysis shows moderate leukocytes with rare bacteria -urine culture added to previous collection.  PRN -Tylenol 664mg q4mpain -Maalox 30ml q429mndigestion -Milk of Mag 30mL, co66mpation -Hydroxyzine 10mg TID 53m  Jovaun Levene  DArmando Reichert 08/13/2021, 10:38 AM

## 2021-08-13 NOTE — Progress Notes (Addendum)
   08/13/21 1000  Psych Admission Type (Psych Patients Only)  Admission Status Involuntary  Psychosocial Assessment  Patient Complaints Anxiety;Depression  Eye Contact Brief  Facial Expression Flat  Affect Appropriate to circumstance  Speech Logical/coherent;Soft  Interaction Minimal  Motor Activity Slow  Appearance/Hygiene Unremarkable  Behavior Characteristics Cooperative;Anxious  Mood Anxious  Aggressive Behavior  Targets Family  Type of Behavior Threatening;Striking out  Effect No apparent injury  Thought Process  Coherency WDL  Content WDL  Delusions None reported or observed  Perception WDL  Hallucination None reported or observed  Judgment Impaired  Confusion None  Danger to Self  Current suicidal ideation? Denies  Danger to Others  Danger to Others None reported or observed  D. Pt presents with a sad affect/depressed, anxious mood - cooperative, but somewhat guarded behavior. Pt did not attend groups, but occasionally visible in the milieu- although with minimal interactions with peers. Per pt's self inventory, pt rated her depression, hopelessness and anxiety all 9's.Pt currently denies SI/HI and AVH and does not appear to be responding to internal stimuli. .  A. Labs and vitals monitored. Pt given and educated on medications. Pt supported emotionally and encouraged to express concerns and ask questions.   R. Pt remains safe with 15 minute checks. Will continue POC.

## 2021-08-13 NOTE — Group Note (Signed)
Date:  08/13/2021 Time:  10:29 AM  Group Topic/Focus:  Goals Group:   The focus of this group is to help patients establish daily goals to achieve during treatment and discuss how the patient can incorporate goal setting into their daily lives to aide in recovery.    Participation Level:  Did Not Attend  Participation Quality:      Affect:      Cognitive:      Insight: None  Engagement in Group:      Modes of Intervention:    Additional Comments:    Garvin Fila 08/13/2021, 10:29 AM

## 2021-08-13 NOTE — Group Note (Signed)
[facility] LCSW Group Therapy Note  Date/Time:  08/13/2021 10:00-11:00AM  Type of Therapy and Topic:  Group Therapy:  Healthy and Unhealthy Supports plus being "My Own Hero"  Participation Level:  Did Not Attend   Description of Group: Patients in this group were invited to identify the differences between healthy and unhealthy supports and then to identify those people in their lives who fall into one category or the other, as well as why.  They were then introduced to the idea of adding more healthy supports and decreasing the unhealthy ones.  Patients discussed what additional healthy supports could be helpful in their recovery and wellness after discharge in order to maintain stability.   An emphasis was placed on using counselor, doctor, therapy groups, 12-step groups, and problem-specific support groups to expand supports.  The song "My Own Hero" was played to encourage full participation in their own recovery journey instead of expecting others to do all the work for them.  The song "I Know Where I've Been" was played as further encouragement that they are further in their journey than they were previously.  Therapeutic Goals:   1)  discuss importance of adding supports to stay well once out of the hospital  2)  compare healthy versus unhealthy supports and identify some examples of each  3)  generate ideas and descriptions of healthy supports that can be added  4)  offer mutual support about how to address unhealthy supports  5)  encourage active participation in and adherence to discharge plan    Summary of Patient Progress:  The patient did not attend this group. Therapeutic Modalities:   Motivational Interviewing Brief Solution-Focused Therapy  Thurston Hole, [place] 08/13/2021 12:57 PM

## 2021-08-14 LAB — URINE CULTURE: Culture: NO GROWTH

## 2021-08-14 MED ORDER — MIRTAZAPINE 7.5 MG PO TABS
7.5000 mg | ORAL_TABLET | Freq: Every day | ORAL | Status: DC
  Administered 2021-08-14 – 2021-08-15 (×2): 7.5 mg via ORAL
  Filled 2021-08-14 (×5): qty 1

## 2021-08-14 NOTE — Progress Notes (Signed)
Patient did not attend the evening speaker AA meeting.  

## 2021-08-14 NOTE — Group Note (Signed)
[facility] LCSW Group Therapy Note   Group Date: 08/14/2021 Start Time: 1300 End Time: 1400  Type of Therapy:  Group Therapy: Cognitive Distortions  Participation Level:  Did Not Attend   Modes of Intervention: Clarification, Confrontation, Discussion, Education, Exploration, Limit-setting, Orientation, Problem-solving, Rapport Building, Art therapist, Socialization and Support   [name], LCSWA
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
## 2021-08-14 NOTE — Progress Notes (Signed)
Pt in stable mood. Denies SI/HI. Denies AVH. Pt has minimal interaction with peers and staff. Pt eager to take new medication to help with insomnia. Pt denies any other complaints.     08/14/21 2115  Psych Admission Type (Psych Patients Only)  Admission Status Involuntary  Psychosocial Assessment  Patient Complaints Insomnia  Eye Contact Brief  Facial Expression Flat  Affect Flat  Speech Logical/coherent;Soft  Interaction Minimal  Motor Activity Slow  Appearance/Hygiene Unremarkable  Behavior Characteristics Cooperative;Appropriate to situation  Mood Pleasant  Thought Process  Coherency WDL  Content WDL  Delusions None reported or observed  Perception WDL  Hallucination None reported or observed  Judgment Impaired  Confusion None  Danger to Self  Current suicidal ideation? Denies  Danger to Others  Danger to Others None reported or observed

## 2021-08-14 NOTE — Progress Notes (Signed)
Pt denies SI/HI/AVH and verbally agrees to approach staff if these become apparent or before harming themselves/others. Rates depression 9/10. Rates anxiety 9/10. Rates pain 0/10.  Pt stated that "my depression was high but then it could be my anxiety that is high." Pt did smile and laugh at one point but is still somewhat isolated. Pt is isolative in her room. RN spoke to husband and husband seemed to be very supportive and cared about the pt. Husband was wanting to know about post discharge planning and asked to speak with Melissa. RN provided information and let husband know that Lenna Sciara will get back to him. Family and pt report seem to not be the same. Husband stated that he wants to make sure that the pt and himself, along with family, are safe. Scheduled medications administered to pt, per MD orders. RN provided support and encouragement to pt. Q15 min safety checks implemented and continued. Pt safe on the unit. RN will continue to monitor and intervene as needed.   08/14/21 0829  Psych Admission Type (Psych Patients Only)  Admission Status Involuntary  Psychosocial Assessment  Patient Complaints Anxiety;Depression  Eye Contact Brief  Facial Expression Flat  Affect Depressed;Sad  Speech Logical/coherent;Soft  Interaction Forwards little;Minimal  Motor Activity Slow  Appearance/Hygiene Unremarkable  Behavior Characteristics Cooperative;Anxious  Mood Anxious;Depressed;Sad  Thought Process  Coherency WDL  Content WDL  Delusions None reported or observed  Perception WDL  Hallucination None reported or observed  Judgment Impaired  Confusion None  Danger to Self  Current suicidal ideation? Denies  Danger to Others  Danger to Others None reported or observed

## 2021-08-15 DIAGNOSIS — G479 Sleep disorder, unspecified: Secondary | ICD-10-CM | POA: Insufficient documentation

## 2021-08-15 DIAGNOSIS — E7800 Pure hypercholesterolemia, unspecified: Secondary | ICD-10-CM | POA: Insufficient documentation

## 2021-08-15 DIAGNOSIS — Z860100 Personal history of colon polyps, unspecified: Secondary | ICD-10-CM | POA: Insufficient documentation

## 2021-08-15 DIAGNOSIS — R413 Other amnesia: Secondary | ICD-10-CM | POA: Insufficient documentation

## 2021-08-15 DIAGNOSIS — M810 Age-related osteoporosis without current pathological fracture: Secondary | ICD-10-CM | POA: Insufficient documentation

## 2021-08-15 DIAGNOSIS — E46 Unspecified protein-calorie malnutrition: Secondary | ICD-10-CM | POA: Diagnosis present

## 2021-08-15 DIAGNOSIS — F419 Anxiety disorder, unspecified: Secondary | ICD-10-CM

## 2021-08-15 NOTE — Progress Notes (Signed)
Pt attended orientation /goals group. Presents guarded, minimal with logical speech, flat affect and depressed mood.  Goal: "I don't want to talk. I was told to just sit in this session".  Pt did not contribute to discussions but was attentive during sessions.

## 2021-08-15 NOTE — Progress Notes (Addendum)
[facility] MD Progress Note  08/15/2021 3:09 PM Jenna [name]  MRN:  [id number] Subjective:  Jenna [name] is a 70 yo patient w/ PPH of MDD who presented to [facility] under IVC and was transferred to [facility] after SI.    Case was discussed in the multidisciplinary team. MAR was reviewed and patient was compliant with medications.  He did not require any PRN's for agitation.   Psychiatric Team made the following recommendations yesterday:  Continue IVC -Continue Lexapro 60m, titrate up as tolerated (r/b/se/a to med discussed and she consents to med trial -BMP-normal. Continue Remeron 7.5 mg nightly to help with appetite stimulant and sleep (r/b/se/a to med discussed and she consents to med trial)  On assessment this AM patient's hair is brushed and patient is wearing a new pair of clothing. Patient reports that slept better overnight and that she is drinking at least 1 boost per day. Patient reports that her sister came to visit last night and she felt the visit went well. Patient reports that she is still having trouble attending groups sessions due to her anxiety about being around other people. Despite this patient reports that she has learned the names of 2 other patient's close to her age and she has started talking to them. Patient reports that she will continue to try to attend groups. Patient denies SI, HI, and AVH. Patient reports that her husband has not come to visit, but they talk on the phone. Patient reports that she believes "he has other things to do." Upon recommendation from provider patient reports that she will at least offer an invitation to her husband to come visit.   Principal Problem: MDD (major depressive disorder), recurrent episode, severe ([place] Diagnosis: Principal Problem:   MDD (major depressive disorder), recurrent episode, severe ([place]  Total Time spent with patient: 15 minutes  Past Psychiatric History: See H&P  Past Medical History:  Past Medical History:  Diagnosis  Date   Anxiety    Hypertension    Hyponatremia    History reviewed. No pertinent surgical history. Family History:  Family History  Problem Relation Age of Onset   Hypertension Mother    Family Psychiatric  History: See H&P Social History:  Social History   Substance and Sexual Activity  Alcohol Use Not Currently   Alcohol/week: 0.0 standard drinks     Social History   Substance and Sexual Activity  Drug Use Not Currently    Social History   Socioeconomic History   Marital status: Married    Spouse name: Not on file   Number of children: Not on file   Years of education: Not on file   Highest education level: Not on file  Occupational History   Not on file  Tobacco Use   Smoking status: Never   Smokeless tobacco: Never  Substance and Sexual Activity   Alcohol use: Not Currently    Alcohol/week: 0.0 standard drinks   Drug use: Not Currently   Sexual activity: Yes  Other Topics Concern   Not on file  Social History Narrative   Not on file   Social Determinants of Health   Financial Resource Strain: Not on file  Food Insecurity: Not on file  Transportation Needs: Not on file  Physical Activity: Not on file  Stress: Not on file  Social Connections: Not on file   Additional Social History:  Sleep: Fair  Appetite:  Poor  Current Medications: Current Facility-Administered Medications  Medication Dose Route Frequency Provider Last Rate Last Admin   acetaminophen (TYLENOL) tablet 650 mg  650 mg Oral Q6H PRN Starkes-Perry, Gayland Curry, FNP       alum & mag hydroxide-simeth (MAALOX/MYLANTA) 200-200-20 MG/5ML suspension 30 mL  30 mL Oral Q4H PRN Starkes-Perry, Gayland Curry, FNP       amLODipine (NORVASC) tablet 10 mg  10 mg Oral Daily Suella Broad, FNP   10 mg at 08/15/21 0855   atenolol (TENORMIN) tablet 50 mg  50 mg Oral BID Suella Broad, FNP   50 mg at 08/15/21 0855   atorvastatin (LIPITOR) tablet 20 mg  20 mg  Oral Daily Suella Broad, FNP   20 mg at 08/15/21 0856   calcium-vitamin D (OSCAL WITH D) 500-5 MG-MCG per tablet 1 tablet  1 tablet Oral Daily Suella Broad, FNP   1 tablet at 08/15/21 0855   escitalopram (LEXAPRO) tablet 5 mg  5 mg Oral Daily Damita Dunnings B, MD   5 mg at 08/15/21 0854   feeding supplement (BOOST / RESOURCE BREEZE) liquid 1 Container  1 Container Oral Q24H Massengill, Ovid Curd, MD   1 Container at 08/14/21 1513   hydrOXYzine (ATARAX) tablet 10 mg  10 mg Oral TID PRN Damita Dunnings B, MD   10 mg at 08/14/21 0829   lisinopril (ZESTRIL) tablet 20 mg  20 mg Oral Daily Suella Broad, FNP   20 mg at 08/15/21 0855   magnesium hydroxide (MILK OF MAGNESIA) suspension 30 mL  30 mL Oral Daily PRN Suella Broad, FNP       melatonin tablet 3 mg  3 mg Oral QHS PRN Nelda Marseille, Amy E, MD       mirtazapine (REMERON) tablet 7.5 mg  7.5 mg Oral Q0600 Nelda Marseille, Amy E, MD   7.5 mg at 08/14/21 2113   multivitamin with minerals tablet 1 tablet  1 tablet Oral Daily Harlow Asa, MD   1 tablet at 08/15/21 1610    Lab Results: No results found for this or any previous visit (from the past 48 hour(s)).  Blood Alcohol level:  Lab Results  Component Value Date   ETH <10 96/12/5407    Metabolic Disorder Labs: Lab Results  Component Value Date   HGBA1C 5.4 08/10/2021   MPG 108.28 08/10/2021   No results found for: PROLACTIN Lab Results  Component Value Date   CHOL 141 08/10/2021   TRIG 82 08/10/2021   HDL 59 08/10/2021   CHOLHDL 2.4 08/10/2021   VLDL 16 08/10/2021   LDLCALC 66 08/10/2021    Physical Findings: AIMS: Facial and Oral Movements Muscles of Facial Expression: None, normal Lips and Perioral Area: None, normal Jaw: None, normal Tongue: None, normal,Extremity Movements Upper (arms, wrists, hands, fingers): None, normal Lower (legs, knees, ankles, toes): None, normal, Trunk Movements Neck, shoulders, hips: None, normal, Overall  Severity Severity of abnormal movements (highest score from questions above): None, normal Incapacitation due to abnormal movements: None, normal Patient's awareness of abnormal movements (rate only patient's report): No Awareness, Dental Status Current problems with teeth and/or dentures?: No Does patient usually wear dentures?: No  CIWA:  CIWA-Ar Total: 1 COWS:  COWS Total Score: 1  Musculoskeletal: Strength & Muscle Tone: within normal limits Gait & Station: normal Patient leans: N/A  Psychiatric Specialty Exam:  Presentation  General Appearance: Appropriate for Environment; Fairly Groomed  Eye Contact:Good  Speech:Clear and  Coherent  Speech Volume:Normal  Handedness:No data recorded  Mood and Affect  Mood:Anxious  Affect:Restricted   Thought Process  Thought Processes:Coherent  Descriptions of Associations:Intact  Orientation:Full (Time, Place and Person)  Thought Content:Logical  History of Schizophrenia/Schizoaffective disorder:No  Duration of Psychotic Symptoms:No data recorded Hallucinations:Hallucinations: None  Ideas of Reference:None  Suicidal Thoughts:Suicidal Thoughts: No  Homicidal Thoughts:Homicidal Thoughts: No   Sensorium  Memory:Immediate Good; Recent Good  Judgment:-- (Improving)  Insight:-- (Improving)   Executive Functions  Concentration:Fair  Attention Span:Fair  [place] of Knowledge:Good  Language:Good   Psychomotor Activity  Psychomotor Activity:Psychomotor Activity: Normal   Assets  Assets:Communication Skills; Housing; Data processing manager; Resilience   Sleep  Sleep:Sleep: Fair    Physical Exam: Physical Exam Constitutional:      Appearance: Normal appearance.  HENT:     Head: Normocephalic and atraumatic.  Pulmonary:     Effort: Pulmonary effort is normal.  Skin:    General: Skin is dry.  Neurological:     Mental Status: She is alert and oriented to person, place, and time.   Review of  Systems  Psychiatric/Behavioral:  Negative for hallucinations and suicidal ideas.   Blood pressure (!) 141/86, pulse (!) 58, temperature 98.1 F (36.7 C), temperature source Oral, resp. rate 16, height _0  (1.499 m), weight 38.6 kg, SpO2 100 %. Body mass index is 17.21 kg/m.   Treatment Plan Summary: Daily contact with patient to assess and evaluate symptoms and progress in treatment and Medication management Nandana Krolikowski is a 70 yo patient w/ PPH of MDD who presented to [facility] under IVC and was transferred to [facility] after SI. Patient appears to be slowly improving in regards to her affect and mood. However patient's anxiety continues to be debilitating and patient struggles with isolative behaviors. Patient's medication appear to be having some benefit, but will continue to monitor.  MDD, recurrent severe w/o psychosis (r/o pseudodementia vs Mild Neurocognitive disorder) - Continue IVC -Continue Lexapro 32m, titrate up as tolerated (monitoring BMP given past h/o hyponatremia on past SSRI trials) -Reviewed MAR and patient has not received Remeron due to scheduling error - have reordered to start tonight at 7.5 mg nightly to help with appetite stimulant and sleep  - Melatonin 3 mg po qhs PRN insomnia - Vistaril 148mtid PRN anxiety - Dementia labs: ESR 11, B12 327, folate 21.7, RPR (-)   - May need to consider ECT if patient continues to fail psychotropic intervention   Possible benzodiazepine withdrawal Recent CIWA 0 -Continue CIWA with as needed Ativan for possible benzodiazepine withdrawal. not restarting home Klonopin at this time.     HTN,hx - Cont home amlodipine 1042m Cont home atenolol 37m17mD - Lisinopril 20mg53mHLD - Lipitor 20mg 67mRN -Tylenol 637mg q64mpain -Maalox 30ml q432mndigestion -Milk of Mag 30mL, co78mpation -Hydroxyzine 10mg TID 34m   PGY-2 Jai B McQuFreida Busman/2022, 3:09 PM

## 2021-08-15 NOTE — Group Note (Signed)
Recreation Therapy Group Note   Group Topic:Animal Assisted Therapy   Group Date: 08/15/2021 Start Time: 42 Facilitators: Adger Cantera, Bjorn Loser, LRT   Animal-Assisted Activity (AAA) Program Checklist/Progress Notes Patient Eligibility Criteria Checklist & Daily Group note for Rec Tx Intervention   AAA/T Program Assumption of Risk Form signed by Patient/ or Parent Legal Guardian YES  Patient is free of allergies or severe asthma  YES  Patient reports no fear of animals YES  Patient reports no history of cruelty to animals YES  Patient understands their participation is voluntary YES   Group Description: Patients provided opportunity to interact with trained and credentialed [employer] Therapy dog and the community volunteer/dog handler.    Affect/Mood: N/A   Participation Level: Did not attend    Clinical Observations/Individualized Feedback: Catera did not join peers in dayroom despite staff invitation to group.   Bjorn Loser Jenna [name], LRT, CTRS 08/15/2021 4:42 PM

## 2021-08-15 NOTE — Progress Notes (Addendum)
CSW spoke with patient husband who reported that he does not feel safe at home and feels like patient needs longer term mental health care.  At this time, he does not feel like any relatives can also take her in her current state.  CSW discussed patient options regarding mental healthcare with her insurance and was transparent that a long term residential facility does not always qualify with her insurance. CSW offered PHP programs and other options for patient husband as well.  CSW agreed that they would research all her options about what she would qualify for and touch base with him later in the day or early tomorrow.    Husband would like to stay informed about discharge and is requesting a family meeting prior to discharge so everyone can discuss treatment plans.   Addendum: CSW spoke with patient regarding discharge. At this time, patient believes she will be returning home to husband.  Patient reports that she does not want long term mental health treatment.  When asked about her mental health and mood, patient reports that she feels "about the same."    CSW researched possible treatment options for longer term mental health treatment.  They are provided below:   [facility] - private pay in [place], [place] ~ 27,000/month  [facility] [place]- accept Medicare, patient will need to be okay with changing admissions to Medicare from [facility] which they would assist with.      Teal Raben, LCSW, [place] Social Worker  [facility]

## 2021-08-15 NOTE — Plan of Care (Signed)
Nurse discussed anxiety and coping skills with patient. 

## 2021-08-15 NOTE — Progress Notes (Signed)
Pt in stable mood. Pt denied any complaints. Pt a little irritable from having to wait to talk to nurse during first shift. Pt remains having minimal interaction with staff and peers. States she just wants to go to bed. She is still depressed from being hospitalized and sad because she wants to home.  She mostly stays in her room. Pt states her insomnia has decreased with new meds given.     08/15/21 2000  Psych Admission Type (Psych Patients Only)  Admission Status Involuntary  Psychosocial Assessment  Patient Complaints Irritability  Eye Contact Fair  Facial Expression Sad  Affect Depressed;Sad  Speech Logical/coherent;Soft  Interaction Minimal  Motor Activity Slow  Appearance/Hygiene Unremarkable  Behavior Characteristics Appropriate to situation  Mood Sad  Thought Process  Coherency WDL  Content WDL  Delusions None reported or observed  Perception WDL  Hallucination None reported or observed  Judgment Limited  Confusion None  Danger to Self  Current suicidal ideation? Denies  Danger to Others  Danger to Others None reported or observed

## 2021-08-15 NOTE — Progress Notes (Signed)
Psychoeducational Group Note  Date:  08/15/2021 Time:  2042  Group Topic/Focus:  Wrap-Up Group:   The focus of this group is to help patients review their daily goal of treatment and discuss progress on daily workbooks.  Participation Level: Did Not Attend  Participation Quality:  Not Applicable  Affect:  Not Applicable  Cognitive:  Not Applicable  Insight:  Not Applicable  Engagement in Group: Not Applicable  Additional Comments:  The patient did not attend group this evening.   Archie Balboa S 08/15/2021, 8:42 PM

## 2021-08-15 NOTE — Progress Notes (Signed)
D:  Patient's self inventory sheet, patient sleeps good, sleep medication helpful.  Fair appetite, normal energy level, good concentration.  Rated depression and anxiety 5, denied hopeless.  Denied withdrawals.  Denied SI.  Denied physical problems.  Denied physical pain.  Goal is relax. A:  Medications administered per MD orders.  Emotional support and encouragement given patient. R:  Denied SI and HI, contracts for safety.  Denied A/V hallucinations.  Safety maintained with 15 minute checks.

## 2021-08-16 ENCOUNTER — Encounter (HOSPITAL_COMMUNITY): Payer: Self-pay

## 2021-08-16 DIAGNOSIS — E559 Vitamin D deficiency, unspecified: Secondary | ICD-10-CM | POA: Diagnosis present

## 2021-08-16 LAB — CBC
HCT: 44 % (ref 36.0–46.0)
Hemoglobin: 14.2 g/dL (ref 12.0–15.0)
MCH: 30.2 pg (ref 26.0–34.0)
MCHC: 32.3 g/dL (ref 30.0–36.0)
MCV: 93.6 fL (ref 80.0–100.0)
Platelets: 307 10*3/uL (ref 150–400)
RBC: 4.7 MIL/uL (ref 3.87–5.11)
RDW: 13.1 % (ref 11.5–15.5)
WBC: 8.2 10*3/uL (ref 4.0–10.5)
nRBC: 0 % (ref 0.0–0.2)

## 2021-08-16 LAB — BASIC METABOLIC PANEL
Anion gap: 8 (ref 5–15)
BUN: 13 mg/dL (ref 8–23)
CO2: 26 mmol/L (ref 22–32)
Calcium: 9 mg/dL (ref 8.9–10.3)
Chloride: 105 mmol/L (ref 98–111)
Creatinine, Ser: 0.67 mg/dL (ref 0.44–1.00)
GFR, Estimated: >60 mL/min (ref >60)
Glucose, Bld: 95 mg/dL (ref 70–99)
Potassium: 3.5 mmol/L (ref 3.5–5.1)
Sodium: 139 mmol/L (ref 135–145)

## 2021-08-16 LAB — VITAMIN D 25 HYDROXY (VIT D DEFICIENCY, FRACTURES): Vit D, 25-Hydroxy: 26.6 ng/mL — ABNORMAL LOW (ref 30–100)

## 2021-08-16 LAB — MAGNESIUM: Magnesium: 2.4 mg/dL (ref 1.7–2.4)

## 2021-08-16 MED ORDER — ESCITALOPRAM OXALATE 10 MG PO TABS
10.0000 mg | ORAL_TABLET | Freq: Every day | ORAL | Status: DC
  Administered 2021-08-17 – 2021-08-24 (×8): 10 mg via ORAL
  Filled 2021-08-16 (×10): qty 1

## 2021-08-16 MED ORDER — ESCITALOPRAM OXALATE 5 MG PO TABS
5.0000 mg | ORAL_TABLET | Freq: Once | ORAL | Status: AC
  Administered 2021-08-16: 11:00:00 5 mg via ORAL
  Filled 2021-08-16 (×2): qty 1

## 2021-08-16 MED ORDER — VITAMIN D (ERGOCALCIFEROL) 1.25 MG (50000 UNIT) PO CAPS
50000.0000 [IU] | ORAL_CAPSULE | ORAL | Status: DC
  Administered 2021-08-16 – 2021-08-23 (×2): 50000 [IU] via ORAL
  Filled 2021-08-16 (×3): qty 1

## 2021-08-16 MED ORDER — MIRTAZAPINE 7.5 MG PO TABS
7.5000 mg | ORAL_TABLET | Freq: Every day | ORAL | Status: DC
  Administered 2021-08-16 – 2021-08-23 (×8): 7.5 mg via ORAL
  Filled 2021-08-16 (×11): qty 1

## 2021-08-16 MED ORDER — MIRTAZAPINE 15 MG PO TABS: 15.0000 mg | ORAL_TABLET | Freq: Every day | ORAL | Status: DC

## 2021-08-16 NOTE — BH IP Treatment Plan (Signed)
Interdisciplinary Treatment and Diagnostic Plan Update  08/16/2021 Time of Session: 9:55am  Jenna [name] MRN: [id number]  Principal Diagnosis: MDD (major depressive disorder), recurrent episode, severe ([place])  Secondary Diagnoses: Principal Problem:   MDD (major depressive disorder), recurrent episode, severe ([place]) Active Problems:   Anxiety   Malnourished ([place])   Current Medications:  Current Facility-Administered Medications  Medication Dose Route Frequency Provider Last Rate Last Admin   acetaminophen (TYLENOL) tablet 650 mg  650 mg Oral Q6H PRN Starkes-Perry, Gayland Curry, FNP       alum & mag hydroxide-simeth (MAALOX/MYLANTA) 200-200-20 MG/5ML suspension 30 mL  30 mL Oral Q4H PRN Starkes-Perry, Gayland Curry, FNP       amLODipine (NORVASC) tablet 10 mg  10 mg Oral Daily Suella Broad, FNP   10 mg at 08/16/21 0759   atenolol (TENORMIN) tablet 50 mg  50 mg Oral BID Suella Broad, FNP   50 mg at 08/16/21 0759   atorvastatin (LIPITOR) tablet 20 mg  20 mg Oral Daily Suella Broad, FNP   20 mg at 08/16/21 1032   calcium-vitamin D (OSCAL WITH D) 500-5 MG-MCG per tablet 1 tablet  1 tablet Oral Daily Suella Broad, FNP   1 tablet at 08/16/21 0759   [START ON 08/17/2021] escitalopram (LEXAPRO) tablet 10 mg  10 mg Oral Daily Freida Busman, MD       feeding supplement (BOOST / RESOURCE BREEZE) liquid 1 Container  1 Container Oral Q24H Massengill, Ovid Curd, MD   1 Container at 08/14/21 1513   hydrOXYzine (ATARAX) tablet 10 mg  10 mg Oral TID PRN Damita Dunnings B, MD   10 mg at 08/16/21 1030   lisinopril (ZESTRIL) tablet 20 mg  20 mg Oral Daily Suella Broad, FNP   20 mg at 08/16/21 7829   magnesium hydroxide (MILK OF MAGNESIA) suspension 30 mL  30 mL Oral Daily PRN Suella Broad, FNP       melatonin tablet 3 mg  3 mg Oral QHS PRN Harlow Asa, MD       mirtazapine (REMERON) tablet 7.5 mg  7.5 mg Oral Q0600 Nelda Marseille, Amy E, MD   7.5 mg at  08/15/21 2001   multivitamin with minerals tablet 1 tablet  1 tablet Oral Daily Harlow Asa, MD   1 tablet at 08/16/21 0759   PTA Medications: Medications Prior to Admission  Medication Sig Dispense Refill Last Dose   acetaminophen (TYLENOL) 325 MG tablet Take 2 tablets (650 mg total) by mouth every 6 (six) hours as needed for mild pain or fever (or Fever >/= 101). (Patient not taking: Reported on 08/10/2021) 12 tablet 0    amLODipine (NORVASC) 10 MG tablet Take 1 tablet (10 mg total) by mouth daily. For BP 30 tablet 3    atenolol (TENORMIN) 50 MG tablet Take 1 tablet (50 mg total) by mouth 2 (two) times daily. For BP 60 tablet 5    atorvastatin (LIPITOR) 20 MG tablet Take 20 mg by mouth daily.      busPIRone (BUSPAR) 15 MG tablet Take 15 mg by mouth 2 (two) times daily.      calcium-vitamin D (OSCAL WITH D) 500-200 MG-UNIT tablet Take 1 tablet by mouth daily. (Patient not taking: Reported on 08/10/2021)      clonazePAM (KLONOPIN) 0.5 MG tablet Take 0.5-1 mg by mouth at bedtime.      denosumab (PROLIA) 60 MG/ML SOSY injection 60 mg every 6 (six) months.  FLUoxetine (PROZAC) 40 MG capsule Take 40 mg by mouth every morning.      lisinopril (ZESTRIL) 20 MG tablet Take 1 tablet (20 mg total) by mouth daily. For BP (Patient taking differently: Take 20 mg by mouth in the morning. For BP) 30 tablet 11     Patient Stressors: Financial difficulties   Health problems   Marital or family conflict    Patient Strengths: Supportive family/friends   Treatment Modalities: Medication Management, Group therapy, Case management,  1 to 1 session with clinician, Psychoeducation, Recreational therapy.   Physician Treatment Plan for Primary Diagnosis: MDD (major depressive disorder), recurrent episode, severe ([place]) Long Term Goal(s): Improvement in symptoms so as ready for discharge   Short Term Goals: Ability to identify changes in lifestyle to reduce recurrence of condition will improve Ability to  verbalize feelings will improve Ability to disclose and discuss suicidal ideas Ability to demonstrate self-control will improve Ability to identify and develop effective coping behaviors will improve Ability to maintain clinical measurements within normal limits will improve Compliance with prescribed medications will improve Ability to identify triggers associated with substance abuse/mental health issues will improve  Medication Management: Evaluate patient's response, side effects, and tolerance of medication regimen.  Therapeutic Interventions: 1 to 1 sessions, Unit Group sessions and Medication administration.  Evaluation of Outcomes: Progressing  Physician Treatment Plan for Secondary Diagnosis: Principal Problem:   MDD (major depressive disorder), recurrent episode, severe ([place]) Active Problems:   Anxiety   Malnourished ([place])  Long Term Goal(s): Improvement in symptoms so as ready for discharge   Short Term Goals: Ability to identify changes in lifestyle to reduce recurrence of condition will improve Ability to verbalize feelings will improve Ability to disclose and discuss suicidal ideas Ability to demonstrate self-control will improve Ability to identify and develop effective coping behaviors will improve Ability to maintain clinical measurements within normal limits will improve Compliance with prescribed medications will improve Ability to identify triggers associated with substance abuse/mental health issues will improve     Medication Management: Evaluate patient's response, side effects, and tolerance of medication regimen.  Therapeutic Interventions: 1 to 1 sessions, Unit Group sessions and Medication administration.  Evaluation of Outcomes: Progressing   RN Treatment Plan for Primary Diagnosis: MDD (major depressive disorder), recurrent episode, severe ([place]) Long Term Goal(s): Knowledge of disease and therapeutic regimen to maintain health will improve  Short  Term Goals: Ability to remain free from injury will improve, Ability to participate in decision making will improve, Ability to verbalize feelings will improve, Ability to disclose and discuss suicidal ideas, and Ability to identify and develop effective coping behaviors will improve  Medication Management: RN will administer medications as ordered by provider, will assess and evaluate patient's response and provide education to patient for prescribed medication. RN will report any adverse and/or side effects to prescribing provider.  Therapeutic Interventions: 1 on 1 counseling sessions, Psychoeducation, Medication administration, Evaluate responses to treatment, Monitor vital signs and CBGs as ordered, Perform/monitor CIWA, COWS, AIMS and Fall Risk screenings as ordered, Perform wound care treatments as ordered.  Evaluation of Outcomes: Progressing   LCSW Treatment Plan for Primary Diagnosis: MDD (major depressive disorder), recurrent episode, severe ([place]) Long Term Goal(s): Safe transition to appropriate next level of care at discharge, Engage patient in therapeutic group addressing interpersonal concerns.  Short Term Goals: Engage patient in aftercare planning with referrals and resources, Increase social support, Increase emotional regulation, Facilitate acceptance of mental health diagnosis and concerns, Identify triggers associated with mental  health/substance abuse issues, and Increase skills for wellness and recovery  Therapeutic Interventions: Assess for all discharge needs, 1 to 1 time with Social worker, Explore available resources and support systems, Assess for adequacy in community support network, Educate family and significant other(s) on suicide prevention, Complete Psychosocial Assessment, Interpersonal group therapy.  Evaluation of Outcomes: Progressing   Progress in Treatment: Attending groups: Yes. Participating in groups: Yes. Taking medication as prescribed:  Yes. Toleration medication: Yes. Family/Significant other contact made: Yes, individual(s) contacted:  Husband  Patient understands diagnosis: Yes. Discussing patient identified problems/goals with staff: Yes. Medical problems stabilized or resolved: Yes. Denies suicidal/homicidal ideation: Yes. Issues/concerns per patient self-inventory: No.   New problem(s) identified: No, Describe:  None  New Short Term/Long Term Goal(s): medication stabilization, elimination of SI thoughts, development of comprehensive mental wellness plan.   Patient Goals: "To be better"/Update   Discharge Plan or Barriers: Patient recently admitted. CSW will continue to follow and assess for appropriate referrals and possible discharge planning.   Reason for Continuation of Hospitalization: Depression Medication stabilization Suicidal ideation  Estimated Length of Stay: 3 to 5 days    Scribe for Treatment Team: Darleen Crocker, Latanya Presser 08/16/2021 12:44 PM

## 2021-08-16 NOTE — Progress Notes (Signed)
Pt denies SI/HI/AVH and verbally agrees to approach staff if these become apparent or before harming themselves/others. Rates depression 5/10. Rates anxiety 7/10. Rates pain 0/10. Pt has been going to meals. Pt stated that she has been in her room a little more today. Pt is improving. She is talking and smiling more. Pt encouraged to go to groups. Pt stated that it was good seeing her husband. Scheduled medications administered to pt, per MD orders. RN provided support and encouragement to pt. Q15 min safety checks implemented and continued. Pt safe on the unit. RN will continue to monitor and intervene as needed.   08/16/21 0759  Psych Admission Type (Psych Patients Only)  Admission Status Involuntary  Psychosocial Assessment  Patient Complaints Anxiety;Depression  Eye Contact Fair  Facial Expression Sad  Affect Depressed  Speech Soft;Logical/coherent  Interaction Minimal  Motor Activity Slow  Appearance/Hygiene Unremarkable  Behavior Characteristics Appropriate to situation;Cooperative;Calm  Mood Sad;Pleasant  Thought Process  Coherency WDL  Content WDL  Delusions None reported or observed  Perception WDL  Hallucination None reported or observed  Judgment Limited  Confusion None  Danger to Self  Current suicidal ideation? Denies  Danger to Others  Danger to Others None reported or observed

## 2021-08-16 NOTE — BHH Group Notes (Signed)
The focus of this group is to help patients establish daily goals to achieve during treatment and discuss how the patient can incorporate goal setting into their daily lives to aide in recovery.  Pt did not attend morning goals group this morning.

## 2021-08-16 NOTE — Progress Notes (Signed)
°   08/16/21 2045  Psych Admission Type (Psych Patients Only)  Admission Status Involuntary  Psychosocial Assessment  Patient Complaints Depression  Eye Contact Fair  Facial Expression Anxious  Affect Depressed  Speech Soft;Logical/coherent  Interaction Minimal  Motor Activity Slow  Appearance/Hygiene Unremarkable  Behavior Characteristics Cooperative;Calm  Mood Depressed;Pleasant  Thought Process  Coherency WDL  Content WDL  Delusions None reported or observed  Perception WDL  Hallucination None reported or observed  Judgment Limited  Confusion None  Danger to Self  Current suicidal ideation? Denies  Danger to Others  Danger to Others None reported or observed   Pt seen in her room. Pt says the day has been quiet for her. Pt denies SI, HI, AVH and pain. Pt rates anxiety 6-7/10 and depression 5/10. Pt says appetite is getting better. Says she ate 40% of her meals today. Says she has been experiencing diarrhea today but refuses any PRNs. Says it is likely d/t eating more foods. Pt denies any withdrawal symptoms. Pt was on Klonopin before admission. Pt did not attend any groups today. "I don't like being in group setting. I have always been like that. It's hard for me."

## 2021-08-16 NOTE — Group Note (Signed)
LCSW Group Therapy Note  Group Date: 08/16/2021 Start Time: 1300 End Time: 1400   Type of Therapy and Topic:  Group Therapy - Healthy vs Unhealthy Coping Skills  Participation Level:  Did Not Attend   Description of Group The focus of this group was to determine what unhealthy coping techniques typically are used by group members and what healthy coping techniques would be helpful in coping with various problems. Patients were guided in becoming aware of the differences between healthy and unhealthy coping techniques. Patients were asked to identify 2-3 healthy coping skills they would like to learn to use more effectively.  Therapeutic Goals Patients learned that coping is what human beings do all day long to deal with various situations in their lives Patients defined and discussed healthy vs unhealthy coping techniques Patients identified their preferred coping techniques and identified whether these were healthy or unhealthy Patients determined 2-3 healthy coping skills they would like to become more familiar with and use more often. Patients provided support and ideas to each other   Summary of Patient Progress:  Did Not Attend   Therapeutic Modalities Cognitive [place], [place] 08/16/2021  1:52 PM

## 2021-08-16 NOTE — Progress Notes (Signed)
Ct was informed of group but did not attend.

## 2021-08-16 NOTE — Progress Notes (Addendum)
[facility] MD Progress Note  08/16/2021 10:56 AM Jenna [name]  MRN:  [id number] Subjective:  Jenna [name] is a 70 yo patient w/ PPH of MDD who presented to [facility] under IVC and was transferred to [facility] after SI.    Case was discussed in the multidisciplinary team. MAR was reviewed and patient was compliant with medications.  He did not require any PRN's for agitation.   Psychiatric Team made the following recommendations yesterday:  Continue IVC -Continue Lexapro 65m, titrate up as tolerated (r/b/se/a to med discussed and she consents to med trial -BMP-normal. Continue Remeron 7.5 mg nightly to help with appetite stimulant and sleep (r/b/se/a to med discussed and she consents to med trial)  ON assessment this AM patient is up, dressed, wearing glasses, and walking at a faster pace. Patient reports that she did not sleep very well last night, but she did attend one group meeting. Patient reports that the group meeting was uncomfortable, and she did not wish to contribute. Patient reports she has been feeling more anxious today and being around others continues to increase her anxiety. Patient endorses that she would rather isolate herself. Patient reports that her husband did come for a visit last night and she calls the visit "ok." Patient reports that they talked about where she would go at discharge, and she is adamant she will be returning home.  Patient denies SI, HI, and AVH. Patient reports that her mood is "ok" today and told SW she feels "about the same". Patient and provider talked about patient attending more group sessions and being less isolative. Patient endorsed that she would consider taking PRN Hydroxyzine to help with her anxiety and work towards accomplishing these tasks.  Principal Problem: MDD (major depressive disorder), recurrent episode, severe ([place] Diagnosis: Principal Problem:   MDD (major depressive disorder), recurrent episode, severe (HCC) Active Problems:   Anxiety    Malnourished ([place]  Total Time spent with patient: 30 minutes   Collateral, husband: Reports that he came to visit last night and reports that patient was adamant that she was coming home at discharge and does not feel that she needs to go anywhere else. Patient reported to him that she would continue to take medicine. Husband reports he still is afraid to be around her and is thinking about leaving himself. Husband reports that he is not seeing much difference/ improvement in their interaction. Husband reports that he had a visit with his psychologist Dr. GCheryln Manly and it was recommended to him that she needs further help than coming home. Husband reports that he is so afraid for his safety that he is thinking about leaving the home and no forwarding address. Husband reports that his daughter has offered to allow patient to stay with him.  Husband reports that daughter cried after her visit a day after presentation. Per husband daughter offered to allow her to stay with them for a while, but patient refused.  Past Psychiatric History: See H&P  Past Medical History:  Past Medical History:  Diagnosis Date   Anxiety    Hypertension    Hyponatremia    History reviewed. No pertinent surgical history. Family History:  Family History  Problem Relation Age of Onset   Hypertension Mother    Family Psychiatric  History: See H&P Social History:  Social History   Substance and Sexual Activity  Alcohol Use Not Currently   Alcohol/week: 0.0 standard drinks     Social History   Substance and Sexual Activity  Drug  Use Not Currently    Social History   Socioeconomic History   Marital status: Married    Spouse name: Not on file   Number of children: Not on file   Years of education: Not on file   Highest education level: Not on file  Occupational History   Not on file  Tobacco Use   Smoking status: Never   Smokeless tobacco: Never  Substance and Sexual Activity   Alcohol use: Not  Currently    Alcohol/week: 0.0 standard drinks   Drug use: Not Currently   Sexual activity: Yes  Other Topics Concern   Not on file  Social History Narrative   Not on file   Social Determinants of Health   Financial Resource Strain: Not on file  Food Insecurity: Not on file  Transportation Needs: Not on file  Physical Activity: Not on file  Stress: Not on file  Social Connections: Not on file   Additional Social History:                         Sleep: Poor  Appetite:  Fair  Current Medications: Current Facility-Administered Medications  Medication Dose Route Frequency Provider Last Rate Last Admin   acetaminophen (TYLENOL) tablet 650 mg  650 mg Oral Q6H PRN Starkes-Perry, Gayland Curry, FNP       alum & mag hydroxide-simeth (MAALOX/MYLANTA) 200-200-20 MG/5ML suspension 30 mL  30 mL Oral Q4H PRN Starkes-Perry, Gayland Curry, FNP       amLODipine (NORVASC) tablet 10 mg  10 mg Oral Daily Suella Broad, FNP   10 mg at 08/16/21 0759   atenolol (TENORMIN) tablet 50 mg  50 mg Oral BID Suella Broad, FNP   50 mg at 08/16/21 0759   atorvastatin (LIPITOR) tablet 20 mg  20 mg Oral Daily Suella Broad, FNP   20 mg at 08/16/21 1032   calcium-vitamin D (OSCAL WITH D) 500-5 MG-MCG per tablet 1 tablet  1 tablet Oral Daily Suella Broad, FNP   1 tablet at 08/16/21 0759   [START ON 08/17/2021] escitalopram (LEXAPRO) tablet 10 mg  10 mg Oral Daily Damita Dunnings B, MD       feeding supplement (BOOST / RESOURCE BREEZE) liquid 1 Container  1 Container Oral Q24H Massengill, Ovid Curd, MD   1 Container at 08/14/21 1513   hydrOXYzine (ATARAX) tablet 10 mg  10 mg Oral TID PRN Damita Dunnings B, MD   10 mg at 08/16/21 1030   lisinopril (ZESTRIL) tablet 20 mg  20 mg Oral Daily Suella Broad, FNP   20 mg at 08/16/21 4970   magnesium hydroxide (MILK OF MAGNESIA) suspension 30 mL  30 mL Oral Daily PRN Suella Broad, FNP       melatonin tablet 3 mg  3 mg Oral QHS  PRN Nelda Marseille, Amy E, MD       mirtazapine (REMERON) tablet 7.5 mg  7.5 mg Oral Q0600 Nelda Marseille, Amy E, MD   7.5 mg at 08/15/21 2001   multivitamin with minerals tablet 1 tablet  1 tablet Oral Daily Harlow Asa, MD   1 tablet at 08/16/21 2637    Lab Results:  Results for orders placed or performed during the hospital encounter of 08/11/21 (from the past 48 hour(s))  VITAMIN D 25 Hydroxy (Vit-D Deficiency, Fractures)     Status: Abnormal   Collection Time: 08/16/21  6:28 AM  Result Value Ref Range   Vit D, 25-Hydroxy  26.60 (L) 30 - 100 ng/mL    Comment: (NOTE) Vitamin D deficiency has been defined by the [place] practice guideline as a level of serum 25-OH  vitamin D less than 20 ng/mL (1,2). The Endocrine Society went on to  further define vitamin D insufficiency as a level between 21 and 29  ng/mL (2).  1. IOM (Institute of Medicine). 2010. Dietary reference intakes for  calcium and D. [place]: The [employer]. 2. Holick MF, Binkley [place], Bischoff-Ferrari HA, et al. Evaluation,  treatment, and prevention of vitamin D deficiency: an Endocrine  Society clinical practice guideline, JCEM. 2011 Jul; 96(7): 1911-30.  Performed at [facility], [place] [address], [place], [place] [address]   Magnesium     Status: None   Collection Time: 08/16/21  6:28 AM  Result Value Ref Range   Magnesium 2.4 1.7 - 2.4 mg/dL    Comment: Performed at [facility], [place] [address] [place], [place] [address]  Basic metabolic panel     Status: None   Collection Time: 08/16/21  6:28 AM  Result Value Ref Range   Sodium 139 135 - 145 mmol/L   Potassium 3.5 3.5 - 5.1 mmol/L   Chloride 105 98 - 111 mmol/L   CO2 26 22 - 32 mmol/L   Glucose, Bld 95 70 - 99 mg/dL    Comment: Glucose reference range applies only to samples taken after fasting for at least 8 hours.   BUN 13 8 - 23 mg/dL   Creatinine, Ser 0.67 0.44 - 1.00 mg/dL    Calcium 9.0 8.9 - 10.3 mg/dL   GFR, Estimated >60 >60 mL/min    Comment: (NOTE) Calculated using the CKD-EPI Creatinine Equation (2021)    Anion gap 8 5 - 15    Comment: Performed at [facility], [place] [address] [place], [place] [address]  CBC     Status: None   Collection Time: 08/16/21  6:28 AM  Result Value Ref Range   WBC 8.2 4.0 - 10.5 K/uL   RBC 4.70 3.87 - 5.11 MIL/uL   Hemoglobin 14.2 12.0 - 15.0 g/dL   HCT 44.0 36.0 - 46.0 %   MCV 93.6 80.0 - 100.0 fL   MCH 30.2 26.0 - 34.0 pg   MCHC 32.3 30.0 - 36.0 g/dL   RDW 13.1 11.5 - 15.5 %   Platelets 307 150 - 400 K/uL   nRBC 0.0 0.0 - 0.2 %    Comment: Performed at [facility], [place] [address], [place], [place] [address]    Blood Alcohol level:  Lab Results  Component Value Date   ETH <10 69/79/4801    Metabolic Disorder Labs: Lab Results  Component Value Date   HGBA1C 5.4 08/10/2021   MPG 108.28 08/10/2021   No results found for: PROLACTIN Lab Results  Component Value Date   CHOL 141 08/10/2021   TRIG 82 08/10/2021   HDL 59 08/10/2021   CHOLHDL 2.4 08/10/2021   VLDL 16 08/10/2021   LDLCALC 66 08/10/2021    Physical Findings: AIMS: Facial and Oral Movements Muscles of Facial Expression: None, normal Lips and Perioral Area: None, normal Jaw: None, normal Tongue: None, normal,Extremity Movements Upper (arms, wrists, hands, fingers): None, normal Lower (legs, knees, ankles, toes): None, normal, Trunk Movements Neck, shoulders, hips: None, normal, Overall Severity Severity of abnormal movements (highest score from questions above): None, normal Incapacitation due to abnormal movements:  None, normal Patient's awareness of abnormal movements (rate only patient's report): No Awareness, Dental Status Current problems with teeth and/or dentures?: No Does patient usually wear dentures?: No  CIWA:  CIWA-Ar Total: 1 COWS:  COWS Total Score: 1  Musculoskeletal: Strength &  Muscle Tone: within normal limits Gait & Station: normal Patient leans: N/A  Psychiatric Specialty Exam:  Presentation  General Appearance: Appropriate for Environment; Casual  Eye Contact:Fair  Speech:Clear and Coherent  Speech Volume:Normal  Handedness:No data recorded  Mood and Affect  Mood:Anxious  Affect:Appropriate; Congruent   Thought Process  Thought Processes:Linear  Descriptions of Associations:Circumstantial  Orientation:Full (Time, Place and Person)  Thought Content:Logical  History of Schizophrenia/Schizoaffective disorder:No  Duration of Psychotic Symptoms:No data recorded Hallucinations:Hallucinations: None  Ideas of Reference:None  Suicidal Thoughts:Suicidal Thoughts: No  Homicidal Thoughts:Homicidal Thoughts: No   Sensorium  Memory:Immediate Good; Recent Fair  Judgment:Impaired  Insight:Shallow   Executive Functions  Concentration:Fair  Attention Span:Fair  Prue  Language:Good   Psychomotor Activity  Psychomotor Activity:Psychomotor Activity: Normal   Assets  Assets:Resilience; Social Support; Communication Skills   Sleep  Sleep:Sleep: Poor    Physical Exam: Physical Exam Constitutional:      Appearance: Normal appearance.  HENT:     Head: Normocephalic and atraumatic.  Eyes:     Extraocular Movements: Extraocular movements intact.  Pulmonary:     Effort: Pulmonary effort is normal.  Neurological:     Mental Status: She is alert and oriented to person, place, and time.   Review of Systems  Neurological:  Negative for headaches.  Psychiatric/Behavioral:  Negative for suicidal ideas.   Blood pressure 116/78, pulse 62, temperature 97.9 F (36.6 C), temperature source Oral, resp. rate 16, height '4\' 11"'  (1.499 m), weight 38.6 kg, SpO2 100 %. Body mass index is 17.21 kg/m.   Treatment Plan Summary: Daily contact with patient to assess and evaluate symptoms and progress in  treatment and Medication management  Jenna [name] is a 70 yo patient w/ PPH of MDD who [facility] and was transferred to [facility] after SI. Per family, patient appears to be perseverate on her discharge and appears to be only apply minimal activity in order to go home. Objectively patient appears to be making modest improvement as noted with patient's hygiene and overall appearance; however would agree with family that patient still remains unmotivated and isolative. Patient is minimally interactive on the mileu requires daily encouragement from provider to try new task.  Patient has very linear thinking, but is somewhat receptive to provider. Patient continues to believe she does not need any additional help and that she will be able to return home with no repercussions for how her mood has impacted her behavior the last few years. Patient continues to have poor insight and does not appear to recognize that her family are deeply concerned and frightened by her behavior.  MDD, recurrent severe w/o psychosis (r/o pseudodementia vs Mild Neurocognitive disorder) - Continue IVC -Increase Lexapro 12m, titrate up as tolerated (monitoring BMP given past h/o hyponatremia on past SSRI trials) -Reviewed MAR and patient has not received Remeron due to scheduling error - have reordered to start tonight at 7.5 mg nightly to help with appetite stimulant and sleep  - Melatonin 3 mg po qhs PRN insomnia - Vistaril 141mtid PRN anxiety - Dementia labs: ESR 11, B12 327, folate 21.7, RPR (-)   - May need to consider ECT if patient continues to fail psychotropic intervention  Possible benzodiazepine withdrawal Recent CIWA 0 -Continue CIWA with as needed Ativan for possible benzodiazepine withdrawal. not restarting home Klonopin at this time.     HTN,hx - Cont home amlodipine 94m - Cont home atenolol 574mBID - Lisinopril 209m  HLD - Lipitor 54m11mutrition - Ergocalciferol 50 000 units PO  weekly     PRN -Tylenol 650mg75m, pain -Maalox 30ml 8m indigestion -Milk of Mag 30mL, 68mtipation -Hydroxyzine 10mg TI66mN  PGY-2 Jai B McFreida Busman14/2022, 10:56 AM

## 2021-08-16 NOTE — Progress Notes (Addendum)
CSW spoke with patient husband and provided options for longer term care.  At this time patient husband was not interested in [place] or the price tag of [place], [place], [employer]. Husband discussed possibility of daughter in [place] being a good place for patient to stay.  CSW will need to get consent from patient to speak with daughter, husband reports that doctors have permission to speak with daughter and they are expecting a call to get an update and next steps.    Alphonse Asbridge, LCSW, [place] Social Worker  [facility]
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
## 2021-08-17 NOTE — Progress Notes (Signed)
Pt denies SI/HI/AVH and verbally agrees to approach staff if these become apparent or before harming themselves/others. Rates depression 5/10. Rates anxiety 6/10. Rates pain 0/10.  Pt has been in room for the majority of the day. Pt will go to meals. Pt has a flat and depressed affect  but will brighten with conversation. Pt is eating better. Scheduled medications administered to pt, per MD orders. RN provided support and encouragement to pt. Q15 min safety checks implemented and continued. Pt safe on the unit. RN will continue to monitor and intervene as needed.   08/17/21 0752  Psych Admission Type (Psych Patients Only)  Admission Status Involuntary  Psychosocial Assessment  Patient Complaints Depression;Anxiety  Eye Contact Fair  Facial Expression Anxious;Sad  Affect Depressed  Speech Logical/coherent;Soft  Interaction Minimal;Forwards little  Motor Activity Slow  Appearance/Hygiene Unremarkable  Behavior Characteristics Cooperative;Calm  Mood Depressed;Pleasant  Thought Process  Coherency WDL  Content WDL  Delusions None reported or observed  Perception WDL  Hallucination None reported or observed  Judgment Impaired  Confusion None  Danger to Self  Current suicidal ideation? Denies  Danger to Others  Danger to Others None reported or observed

## 2021-08-17 NOTE — BHH Group Notes (Signed)
Pt did not attend morning goals group. 

## 2021-08-17 NOTE — Progress Notes (Signed)
Psychoeducational Group Note  Date:  08/17/2021 Time:  2015  Group Topic/Focus:  Wrap up group  Participation Level: Did Not Attend  Participation Quality:  Not Applicable  Affect:  Not Applicable  Cognitive:  Not Applicable  Insight:  Not Applicable  Engagement in Group: Not Applicable  Additional Comments:  Did not attend.   Winfield Rast S 08/17/2021, 9:01 PM

## 2021-08-17 NOTE — BHH Counselor (Signed)
CSW spoke with patient daughter, Claiborne Billings, regarding patient treatment and care.  CSW also spoke with Claiborne Billings about potential ECT treatment and being able to be discharged to live with her.  Claiborne Billings reports that it is something that she will need to talk with her family about but it may be a possibility.    Claiborne Billings reports that patient has been severely depressed to the point where she had to unmat her hair from her mother not properly caring for herself.  Claiborne Billings reports that she came to visit her mother and feels like patient did not want to get better and that she is scared that if patient comes home she will complete suicide.    CSW provided sister with CSW contact information and encouraged to call if she had further questions.  Claiborne Billings requested for the physician to call her as well for more information about her treatment and discharge planning.    Ilisa Hayworth, LCSW, [place] Social Worker  [facility]

## 2021-08-17 NOTE — BHH Counselor (Addendum)
CSW called [facility] for information about ECT services. Contact number is [phone]. CSW directed to a care coordinator for ECT services and CSW left a voicemail about next steps to make a referral and qualifications for services.   Addendum: CSW received a call back from care coordinator for [facility] in [place], [place]. They provided CSW with a referral form and indicated a physician would need to do the referral.  They also informed CSW that if patient was looking for inpatient treatment at the hospital that patient would need to be evaluated in ED to be connected to ECT inpatient services.  CSW will provide referral forms to patient doctor.    Alaisha Eversley, LCSW, [place] Social Worker  [facility]

## 2021-08-18 MED ORDER — OLANZAPINE 2.5 MG PO TABS
2.5000 mg | ORAL_TABLET | Freq: Every day | ORAL | Status: DC
  Administered 2021-08-18 – 2021-08-20 (×3): 2.5 mg via ORAL
  Filled 2021-08-18 (×4): qty 1

## 2021-08-18 NOTE — Plan of Care (Signed)
Nurse discussed coping skills with patient.  

## 2021-08-18 NOTE — Progress Notes (Signed)
° ° °   08/17/21 2000  Psych Admission Type (Psych Patients Only)  Admission Status Involuntary  Psychosocial Assessment  Patient Complaints Anxiety;Depression  Eye Contact Fair  Facial Expression Anxious;Sad  Affect Depressed  Speech Logical/coherent;Soft  Interaction Minimal;Forwards little  Motor Activity Slow  Appearance/Hygiene Unremarkable  Behavior Characteristics Cooperative;Appropriate to situation  Thought Process  Coherency WDL  Content WDL  Delusions None reported or observed  Perception WDL  Hallucination None reported or observed  Judgment Impaired  Confusion None  Danger to Self  Current suicidal ideation? Denies  Danger to Others  Danger to Others None reported or observed

## 2021-08-18 NOTE — Progress Notes (Addendum)
[facility] MD Progress Note  08/18/2021 12:48 PM Jenna [name]  MRN:  [id number] Subjective:  Jenna [name] is a 70 yo patient w/ PPH of MDD who presented to [facility] under IVC and was transferred to [facility] after SI.    Case was discussed in the multidisciplinary team. MAR was reviewed and patient was compliant with medications.  She did not require any PRN's for agitation.   Psychiatric Team made the following recommendations yesterday:  Continue IVC -Continue Lexapro to 92m, titrate up as tolerated (r/b/se/a to med discussed and she consents to med trial -BMP-normal. Continue Remeron 7.5 mg nightly to help with appetite stimulant and sleep (r/b/se/a to med discussed and she consents to med trial)  Staff reports that they are also concerned about patient's increasing isolating behaviors. Patient has appeared to become more withdrawn since losing a roommate. Patient was put on room lock out and was noted to attend group therapy. Patient spoke in group therapy and on assessment this morning reported that she shared that her goal is "to paint again." Patient endorsed that she still is not sleeping well. Patient reports she wakes up early around 2-3 AM and is having a hard time falling asleep. Patient reports that she saw her husband last night. Patient reports that she does not think her husband is doing well. Patient reports that she noticed this last night while talking about the possibility of her coming home. Patient endorses that she is also still not showering.   Provider discussed with patient that ECT inpatient has been difficult to scheduled; however patient is still not stable enough for discharge and will receive medication adjustments that target psychotic symptoms. Patient endorsed that she believes she is psychotic and has thoughts this for a while, patient reported " When I was seeing BMarguarite Arbour I thought I was psychotic but was afraid to say anything, because I thought nothing could be  done." Patient reports that she is struggling with ruminating on the negative things she has done.   Patient denied SI, HI, and AVH.  Collateral, husband: Reports that the visit was less confrontational. Patient also acknowledged that she was not following through on her care. [place]. Husband reports that patient's son-in law is concerned about patient staying [place] them. Husband reports that in their conversation last night the patient revealed she does not recall a lot of the events that led to hospitalization.  Principal Problem: MDD (major depressive disorder), recurrent episode, severe ([place] Diagnosis: Principal Problem:   MDD (major depressive disorder), recurrent episode, severe ([place] Active Problems:   Anxiety   Malnourished ([place]   Vitamin D deficiency  Total Time spent with patient: 15 minutes  Past Psychiatric History: See H&P  Past Medical History:  Past Medical History:  Diagnosis Date   Anxiety    Hypertension    Hyponatremia    History reviewed. No pertinent surgical history. Family History:  Family History  Problem Relation Age of Onset   Hypertension Mother    Family Psychiatric  History: See H&P Social History:  Social History   Substance and Sexual Activity  Alcohol Use Not Currently   Alcohol/week: 0.0 standard drinks     Social History   Substance and Sexual Activity  Drug Use Not Currently    Social History   Socioeconomic History   Marital status: Married    Spouse name: Not on file  Number of children: Not on file   Years of education: Not on file   Highest education level: Not on file  Occupational History   Not on file  Tobacco Use   Smoking status: Never   Smokeless tobacco: Never  Substance and Sexual Activity   Alcohol use: Not Currently    Alcohol/week: 0.0 standard drinks   Drug use: Not Currently   Sexual  activity: Yes  Other Topics Concern   Not on file  Social History Narrative   Not on file   Social Determinants of Health   Financial Resource Strain: Not on file  Food Insecurity: Not on file  Transportation Needs: Not on file  Physical Activity: Not on file  Stress: Not on file  Social Connections: Not on file   Additional Social History:                         Sleep: Poor  Appetite:  Poor  Current Medications: Current Facility-Administered Medications  Medication Dose Route Frequency Provider Last Rate Last Admin   acetaminophen (TYLENOL) tablet 650 mg  650 mg Oral Q6H PRN Starkes-Perry, Gayland Curry, FNP       alum & mag hydroxide-simeth (MAALOX/MYLANTA) 200-200-20 MG/5ML suspension 30 mL  30 mL Oral Q4H PRN Starkes-Perry, Gayland Curry, FNP       amLODipine (NORVASC) tablet 10 mg  10 mg Oral Daily Suella Broad, FNP   10 mg at 08/18/21 0809   atenolol (TENORMIN) tablet 50 mg  50 mg Oral BID Suella Broad, FNP   50 mg at 08/18/21 0809   atorvastatin (LIPITOR) tablet 20 mg  20 mg Oral Daily Suella Broad, FNP   20 mg at 08/18/21 0809   calcium-vitamin D (OSCAL WITH D) 500-5 MG-MCG per tablet 1 tablet  1 tablet Oral Daily Suella Broad, FNP   1 tablet at 08/18/21 4010   escitalopram (LEXAPRO) tablet 10 mg  10 mg Oral Daily Damita Dunnings B, MD   10 mg at 08/18/21 2725   feeding supplement (BOOST / RESOURCE BREEZE) liquid 1 Container  1 Container Oral Q24H Massengill, Ovid Curd, MD   1 Container at 08/14/21 1513   hydrOXYzine (ATARAX) tablet 10 mg  10 mg Oral TID PRN Damita Dunnings B, MD   10 mg at 08/16/21 1030   lisinopril (ZESTRIL) tablet 20 mg  20 mg Oral Daily Suella Broad, FNP   20 mg at 08/18/21 3664   magnesium hydroxide (MILK OF MAGNESIA) suspension 30 mL  30 mL Oral Daily PRN Suella Broad, FNP       melatonin tablet 3 mg  3 mg Oral QHS PRN Nelda Marseille, Amy E, MD       mirtazapine (REMERON) tablet 7.5 mg  7.5 mg Oral QHS  Hill, Jackie Plum, MD   7.5 mg at 08/17/21 2102   multivitamin with minerals tablet 1 tablet  1 tablet Oral Daily Harlow Asa, MD   1 tablet at 08/18/21 0808   OLANZapine zydis (ZYPREXA) disintegrating tablet 2.5 mg  2.5 mg Oral QHS Freida Busman, MD       Vitamin D (Ergocalciferol) (DRISDOL) capsule 50,000 Units  50,000 Units Oral Q7 days Maida Sale, MD   50,000 Units at 08/16/21 2053    Lab Results: No results found for this or any previous visit (from the past 48 hour(s)).  Blood Alcohol level:  Lab Results  Component Value Date   ETH <10  21/11/1279    Metabolic Disorder Labs: Lab Results  Component Value Date   HGBA1C 5.4 08/10/2021   MPG 108.28 08/10/2021   No results found for: PROLACTIN Lab Results  Component Value Date   CHOL 141 08/10/2021   TRIG 82 08/10/2021   HDL 59 08/10/2021   CHOLHDL 2.4 08/10/2021   VLDL 16 08/10/2021   LDLCALC 66 08/10/2021    Physical Findings: AIMS: Facial and Oral Movements Muscles of Facial Expression: None, normal Lips and Perioral Area: None, normal Jaw: None, normal Tongue: None, normal,Extremity Movements Upper (arms, wrists, hands, fingers): None, normal Lower (legs, knees, ankles, toes): None, normal, Trunk Movements Neck, shoulders, hips: None, normal, Overall Severity Severity of abnormal movements (highest score from questions above): None, normal Incapacitation due to abnormal movements: None, normal Patient's awareness of abnormal movements (rate only patient's report): No Awareness, Dental Status Current problems with teeth and/or dentures?: No Does patient usually wear dentures?: No  CIWA:  CIWA-Ar Total: 1 COWS:  COWS Total Score: 1  Musculoskeletal: Strength & Muscle Tone: within normal limits Gait & Station: normal Patient leans: N/A  Psychiatric Specialty Exam:  Presentation  General Appearance: Casual (not bathed but in a new shirt)  Eye Contact:Good  Speech:Clear and  Coherent  Speech Volume:Normal  Handedness:No data recorded  Mood and Affect  Mood:Anxious  Affect:Congruent   Thought Process  Thought Processes:Coherent  Descriptions of Associations:Intact  Orientation:Full (Time, Place and Person)  Thought Content:Logical  History of Schizophrenia/Schizoaffective disorder:No  Duration of Psychotic Symptoms:No data recorded Hallucinations:Hallucinations: None  Ideas of Reference:None  Suicidal Thoughts:Suicidal Thoughts: No  Homicidal Thoughts:Homicidal Thoughts: No   Sensorium  Memory:Immediate Good; Recent Good  Judgment:Impaired  Insight:Shallow   Executive Functions  Concentration:Fair  Attention Span:Fair  [place] of Knowledge:Good  Language:Good   Psychomotor Activity  Psychomotor Activity:Psychomotor Activity: Normal   Assets  Assets:Social Support   Sleep  Sleep:Sleep: Poor    Physical Exam: Physical Exam Constitutional:      Appearance: Normal appearance.  HENT:     Head: Normocephalic and atraumatic.  Eyes:     Extraocular Movements: Extraocular movements intact.  Pulmonary:     Effort: Pulmonary effort is normal.  Skin:    General: Skin is dry.  Neurological:     Mental Status: She is alert and oriented to person, place, and time.   Review of Systems  Psychiatric/Behavioral:  Negative for hallucinations and suicidal ideas.   Blood pressure 110/77, pulse 61, temperature 97.9 F (36.6 C), temperature source Oral, resp. rate 14, height 4' 11" (1.499 m), weight 38.6 kg, SpO2 100 %. Body mass index is 17.21 kg/m.   Treatment Plan Summary: Daily contact with patient to assess and evaluate symptoms and progress in treatment and Medication management Areta Terwilliger is a 70 yo patient w/ PPH of MDD who presented to [facility] under IVC and was transferred to [facility] after SI. Patient appears to be less guarded and opened up and was receptive to starting SGA. Patient endorsed for the first  time that she does feel mentally unwell AND benefits from her hospitalization. Patient appears less hopeless today. Patient continues to have poor appeite and hygiene, but with room lockout appears to be adjusting well and picking up activities on the unit. MDD, recurrent severe w/o psychosis (r/o pseudodementia vs Mild Neurocognitive disorder) - Continue IVC -Continue Lexapro 34m, titrate up as tolerated (monitoring BMP given past h/o hyponatremia on past SSRI trials) -Continue Remeron 7.532mQHS - Start Zyprexa zydis 2.97m23mHS -  Start room lock out during the day - Melatonin 3 mg po qhs PRN insomnia - Vistaril 19m tid PRN anxiety - Dementia labs: ESR 11, B12 327, folate 21.7, RPR (-)   -  Consider ECT at this time   Possible benzodiazepine withdrawal Recent CIWA 0 -Continue CIWA with as needed Ativan for possible benzodiazepine withdrawal. not restarting home Klonopin at this time.     HTN,hx - Cont home amlodipine 131m- Cont home atenolol 5029mID - Lisinopril 62m51m HLD - Lipitor 62mg46mutrition - Ergocalciferol 50 000 units PO weekly     PRN -Tylenol 650mg 29m pain -Maalox 30ml q46mindigestion -Milk of Mag 30mL, c31mipation -Hydroxyzine 10mg TID1m   PGY-2  B McQFreida Busman6/2022, 12:48 PM

## 2021-08-18 NOTE — Progress Notes (Signed)
CSW spoke with patient husband who informed this CSW that patient will be unable to go and live with her daughter and her family.  Daughter and family are worried about their safety if she stays with them.  Husband wanted some additional information about the ECT treatment and wanted to have realistic expectations of when we would decide to discharge patient.  CSW provided information she knew as well as progress of patient.    Husband requested to speak with doctor for more information about ECT treatment and wants to have a family meeting prior to patient being discharged that he and patient daughter can attend to set out expectations of discharge and follow up treatment.  CSW agreed to let doctor know about follow up requests.    Khyson Sebesta, LCSW, Holiday Social Worker  [facility]

## 2021-08-18 NOTE — Group Note (Signed)
LCSW Group Therapy Note   Group Date: 08/18/2021 Start Time: 0945 End Time: 5361  LCSW Group Therapy Note 08/18/2021   Type of Therapy and Topic:  Group Therapy:  Setting Goals  Participation Level:  Active  Description of Group: In this process group, patients discussed using strengths to work toward goals and address challenges.  Patients identified two positive things about themselves and one goal they were working on.  Patients were given the opportunity to share openly and support each others plan for self-empowerment.  The group discussed the value of gratitude and were encouraged to have a daily reflection of positive characteristics or circumstances.  Patients were encouraged to identify a plan to utilize their strengths to work on current challenges and goals.  Therapeutic Goals Patient will verbalize personal strengths/positive qualities and relate how these can assist with achieving desired personal goals Patients will verbalize affirmation of peers plans for personal change and goal setting Patients will explore the value of gratitude and positive focus as related to successful achievement of goals Patients will verbalize a plan for regular reinforcement of personal positive qualities and circumstances.  Summary of Patient Progress:Pt came to group and participated appropriately. Pt offered input to peers during discussion. Pt shared during introductions that her goal while being in the hospital is to be better for her family so she can be more present.       Therapeutic Modalities Cognitive Behavioral Therapy Motivational Interviewing    [name], Latanya Presser 08/18/2021 10:05 AM   Junie Panning Cory Roughen, LCSWA 08/18/2021  10:05 AM

## 2021-08-18 NOTE — Progress Notes (Signed)
NUTRITION NOTE RD working remotely.   Patient screened on the MST report at the beginning of admission and note written on 12/9 for this. Consult received for assessment of nutrition requirements and status.   Reviewed Resident's note from 12/15. Note outlines ongoing severe depression with patient reporting lapse in personal hygiene (not showering, wearing the same clothes more than one day in a row), decreased appetite and oral intake, and that Ensure/Boost was hurting her stomach so she has opted to not drink it.   Remeron has been added by medical care team to aid in appetite stimulation and depression.   Once acute psych admission is completed, recommend consult to outpatient RD if poor appetite and PO intakes persist.      Jarome Matin, MS, RD, LDN, CNSC Inpatient Clinical Dietitian RD pager # available in [place]  After hours/weekend pager # available in [facility]

## 2021-08-18 NOTE — Progress Notes (Signed)
D:  Patient's self inventory sheet, patient has fair sleep, sleep medication not helpful.  Fair appetite, normal energy level, good concentration.  Rated anxiety and depression 5, hopeless 2.  Withdrawals, checked tremors, chilling, agitation.  Denied SI.  Denied physical problems.   A:  Medications administered per MD orders.  Emotional support and encouragement given patient. R:  Denied SI and HI, contracts for safety.  Denied A/V hallucinations.  Safety maintained with 15 minute checks.

## 2021-08-18 NOTE — Progress Notes (Signed)
Pt did not attend the evening AA group. 

## 2021-08-19 NOTE — B[facility] Group Notes (Signed)
Psychoeducational Group Note ° ° ° °Date:12/17//2022 °Time: 1300-1400 ° ° ° °Purpose of Group: . The group focus' on teaching patients on how to identify their needs and their Life Skills:  A group where two lists are made. What people need and what are things that we do that are unhealthy. The lists are developed by the patients and it is explained that we often do the actions that are not healthy to get our list of needs met. ° °Goal:: to develop the coping skills needed to get their needs met ° °Participation Level:  Active ° °Participation Quality:  Appropriate ° °Affect:  Appropriate ° °Cognitive:  Oriented ° °Insight:  Improving ° °Engagement in Group:  Engaged ° °Additional Comments: Pt participated fully in this group. Was able to help create both of the lists needs. The first list is about the needs of the human race. The second list is how human beings try to get their needs met. Pt was quite involved in the forming of the two groups. ° °,  A ° °

## 2021-08-19 NOTE — Group Note (Signed)
LCSW Group Therapy Note  Group Date: 08/19/2021 Start Time: 1000 End Time: 1100   Type of Therapy and Topic:  Group Therapy: Anger Cues and Responses  Participation Level:  Active   Description of Group:   In this group, patients learned how to recognize the physical, cognitive, emotional, and behavioral responses they have to anger-provoking situations.  They identified a recent time they became angry and how they reacted.  They analyzed how their reaction was possibly beneficial and how it was possibly unhelpful.  The group discussed a variety of healthier coping skills that could help with such a situation in the future.  Focus was placed on how helpful it is to recognize the underlying emotions to our anger, because working on those can lead to a more permanent solution as well as our ability to focus on the important rather than the urgent.  Therapeutic Goals: Patients will remember their last incident of anger and how they felt emotionally and physically, what their thoughts were at the time, and how they behaved. Patients will identify how their behavior at that time worked for them, as well as how it worked against them. Patients will explore possible new behaviors to use in future anger situations. Patients will learn that anger itself is normal and cannot be eliminated, and that healthier reactions can assist with resolving conflict rather than worsening situations.  Summary of Patient Progress:  Jenna [name] was active during the group. She shared a recent occurrence wherein feeling that her treatment (medication management and therapy) did not work led to anger and judgment toward herself. She demonstrated fair insight into the subject matter, was respectful of peers, and participated throughout the entire session.  Therapeutic Modalities:   Cognitive Behavioral Therapy    Marquette Old 08/19/2021  2:41 PM

## 2021-08-19 NOTE — Progress Notes (Signed)
°   08/19/21 0048  Psych Admission Type (Psych Patients Only)  Admission Status Involuntary  Psychosocial Assessment  Patient Complaints Anxiety  Eye Contact Fair  Facial Expression Anxious  Affect Appropriate to circumstance  Speech Logical/coherent  Interaction Minimal  Motor Activity Slow  Appearance/Hygiene Unremarkable  Behavior Characteristics Appropriate to situation  Mood Anxious  Thought Process  Coherency WDL  Content WDL  Delusions None reported or observed  Perception WDL  Hallucination None reported or observed  Judgment Impaired  Confusion None  Danger to Self  Current suicidal ideation? Denies  Danger to Others  Danger to Others None reported or observed

## 2021-08-19 NOTE — Progress Notes (Signed)
Psychoeducational Group Note  Date:  08/19/2021 Time:  2015  Group Topic/Focus:  Wrap up group  Participation Level: Did Not Attend  Participation Quality:  Not Applicable  Affect:  Not Applicable  Cognitive:  Not Applicable  Insight:  Not Applicable  Engagement in Group: Not Applicable  Additional Comments:  Did not attend  Shellia Cleverly 08/19/2021, 11:00 PM

## 2021-08-19 NOTE — Progress Notes (Signed)
°  D: Patient is alert and oriented x 4. Patient denies SI/HI/ AVH and pain. Disposition is Calm and cooperative with appropriate affect. Verbally contracts for safety to this Probation officer.   A:  Pt was given scheduled medications. Pt was encourage to attend groups. Q 15 minute checks were done for safety. Pt was visualized interacting appropriately with others in the milieu. Pt was offered support and encouragement by this Probation officer. Pt is goal oriented and stated goal was reached for the day. Pt attended groups and interacts well with peers and staff. Pt is taking medication. Pt complied with scheduled medications. No signs of distress nor concerns reported by patient at present.Pt remains receptive to treatment and safety maintained on unit.    R: Will continue to monitor and assess. Safety maintained during this shift.

## 2021-08-19 NOTE — [facility] Group Notes (Signed)
Goals Group 08/19/21    Group Focus: affirmation, clarity of thought, and goals/reality orientation Treatment Modality:  Psychoeducation Interventions utilized were assignment, group exercise, and support Purpose: To be able to understand and verbalize the reason for their admission to the hospital. To understand that the medication helps with their chemical imbalance but they also need to work on their choices in life. To be challenged to develop a list of 30 positives about themselves. Also introduce the concept that "feelings" are not reality.  Participation Level:  Active  Participation Quality:  Appropriate  Affect:  Appropriate  Cognitive:  Appropriate  Insight:  Improving  Engagement in Group:  Engaged  Additional Comments:  Pt rates her energy level at a 6/10. States the reason she is here is due to suicidal ideation. Pt participated within the group. Gave and received feedback appropriately.  Paulino Rily

## 2021-08-19 NOTE — Progress Notes (Signed)
[facility] MD Progress Note  08/19/2021 5:47 PM Jenna [name]  MRN:  [id number] Subjective:   Jenna [name] is a 70 yo patient w/ PPH of MDD who presented under IVC after Suicide Attempt via Overdose.     Case was discussed in the multidisciplinary team. MAR was reviewed and patient was compliant with medications.  She did not require any PRN's for agitation.    Psychiatric Team made the following recommendations yesterday:  -Continue IVC -Continue Lexapro to 10mg  daily -Continue Remeron 7.5mg  QHS -Start Zyprexa Zydis 2.5mg  QHS -Start Room Lockout order    On interview today patient reports that she is doing "so-so".  She reports that she slept okay last night.  She reports that her appetite is always kind of poor and has still not recovered.  She reports no SI, HI, or AVH.  Asked her if she had thought further about [place] or ECT.  She stated that she would like to go more in-depth and above them.  Discussed the risks and benefits of each procedure, the usual number of treatments required for each, and where she could receive such treatments.  After this discussion she stated that she was still not interested in either of them because of the time commitment involved with them.  I then asked her if she felt like her medicines were adequate.  She states that she thinks they are helping but not quite there yet.  When asked if she would like an increase in any her medications she stated that she does not want to at this time.  She then asked what the Zyprexa was for and discussed that it is being used to augment her antidepressant medication.  She reported no other concerns at present.   Principal Problem: MDD (major depressive disorder), recurrent episode, severe ([place]) Diagnosis: Principal Problem:   MDD (major depressive disorder), recurrent episode, severe ([place]) Active Problems:   Anxiety   Malnourished ([place])   Vitamin D deficiency  Total Time spent with patient: 15 minutes  Past  Psychiatric History: MDD  Past Medical History:  Past Medical History:  Diagnosis Date   Anxiety    Hypertension    Hyponatremia    History reviewed. No pertinent surgical history. Family History:  Family History  Problem Relation Age of Onset   Hypertension Mother    Family Psychiatric  History: Mother - Depression and Anxiety Social History:  Social History   Substance and Sexual Activity  Alcohol Use Not Currently   Alcohol/week: 0.0 standard drinks     Social History   Substance and Sexual Activity  Drug Use Not Currently    Social History   Socioeconomic History   Marital status: Married    Spouse name: Not on file   Number of children: Not on file   Years of education: Not on file   Highest education level: Not on file  Occupational History   Not on file  Tobacco Use   Smoking status: Never   Smokeless tobacco: Never  Substance and Sexual Activity   Alcohol use: Not Currently    Alcohol/week: 0.0 standard drinks   Drug use: Not Currently   Sexual activity: Yes  Other Topics Concern   Not on file  Social History Narrative   Not on file   Social Determinants of Health   Financial Resource Strain: Not on file  Food Insecurity: Not on file  Transportation Needs: Not on file  Physical Activity: Not on file  Stress: Not on file  Social Connections: Not on file   Additional Social History:                         Sleep: Poor  Appetite:  Poor  Current Medications: Current Facility-Administered Medications  Medication Dose Route Frequency Provider Last Rate Last Admin   acetaminophen (TYLENOL) tablet 650 mg  650 mg Oral Q6H PRN Starkes-Perry, Gayland Curry, FNP       alum & mag hydroxide-simeth (MAALOX/MYLANTA) 200-200-20 MG/5ML suspension 30 mL  30 mL Oral Q4H PRN Starkes-Perry, Gayland Curry, FNP       amLODipine (NORVASC) tablet 10 mg  10 mg Oral Daily Suella Broad, FNP   10 mg at 08/19/21 0743   atenolol (TENORMIN) tablet 50 mg  50 mg  Oral BID Suella Broad, FNP   50 mg at 08/19/21 1654   atorvastatin (LIPITOR) tablet 20 mg  20 mg Oral Daily Suella Broad, FNP   20 mg at 08/19/21 0743   calcium-vitamin D (OSCAL WITH D) 500-5 MG-MCG per tablet 1 tablet  1 tablet Oral Daily Suella Broad, FNP   1 tablet at 08/19/21 0743   escitalopram (LEXAPRO) tablet 10 mg  10 mg Oral Daily Damita Dunnings B, MD   10 mg at 08/19/21 0743   feeding supplement (BOOST / RESOURCE BREEZE) liquid 1 Container  1 Container Oral Q24H Massengill, Ovid Curd, MD   1 Container at 08/19/21 1515   hydrOXYzine (ATARAX) tablet 10 mg  10 mg Oral TID PRN Damita Dunnings B, MD   10 mg at 08/16/21 1030   lisinopril (ZESTRIL) tablet 20 mg  20 mg Oral Daily Suella Broad, FNP   20 mg at 08/19/21 0539   magnesium hydroxide (MILK OF MAGNESIA) suspension 30 mL  30 mL Oral Daily PRN Suella Broad, FNP       melatonin tablet 3 mg  3 mg Oral QHS PRN Nelda Marseille, Amy E, MD       mirtazapine (REMERON) tablet 7.5 mg  7.5 mg Oral QHS Hill, Jackie Plum, MD   7.5 mg at 08/18/21 2012   multivitamin with minerals tablet 1 tablet  1 tablet Oral Daily Harlow Asa, MD   1 tablet at 08/19/21 0743   OLANZapine (ZYPREXA) tablet 2.5 mg  2.5 mg Oral QHS Damita Dunnings B, MD   2.5 mg at 08/18/21 2012   Vitamin D (Ergocalciferol) (DRISDOL) capsule 50,000 Units  50,000 Units Oral Q7 days Maida Sale, MD   50,000 Units at 08/16/21 2053    Lab Results: No results found for this or any previous visit (from the past 48 hour(s)).  Blood Alcohol level:  Lab Results  Component Value Date   ETH <10 76/73/4193    Metabolic Disorder Labs: Lab Results  Component Value Date   HGBA1C 5.4 08/10/2021   MPG 108.28 08/10/2021   No results found for: PROLACTIN Lab Results  Component Value Date   CHOL 141 08/10/2021   TRIG 82 08/10/2021   HDL 59 08/10/2021   CHOLHDL 2.4 08/10/2021   VLDL 16 08/10/2021   LDLCALC 66 08/10/2021    Physical  Findings: AIMS: Facial and Oral Movements Muscles of Facial Expression: None, normal Lips and Perioral Area: None, normal Jaw: None, normal Tongue: None, normal,Extremity Movements Upper (arms, wrists, hands, fingers): None, normal Lower (legs, knees, ankles, toes): None, normal, Trunk Movements Neck, shoulders, hips: None, normal, Overall Severity Severity of abnormal movements (highest score from questions above):  None, normal Incapacitation due to abnormal movements: None, normal Patient's awareness of abnormal movements (rate only patient's report): No Awareness, Dental Status Current problems with teeth and/or dentures?: No Does patient usually wear dentures?: No  CIWA:  CIWA-Ar Total: 1 COWS:  COWS Total Score: 2  Musculoskeletal: Strength & Muscle Tone: within normal limits Gait & Station: normal Patient leans: N/A  Psychiatric Specialty Exam:  Presentation  General Appearance: Appropriate for Environment; Casual  Eye Contact:Good  Speech:Clear and Coherent; Normal Rate  Speech Volume:Normal  Handedness:No data recorded  Mood and Affect  Mood:Anxious; Dysphoric  Affect:Congruent   Thought Process  Thought Processes:Coherent  Descriptions of Associations:Intact  Orientation:Full (Time, Place and Person)  Thought Content:Rumination  History of Schizophrenia/Schizoaffective disorder:No  Duration of Psychotic Symptoms:No data recorded Hallucinations:Hallucinations: None  Ideas of Reference:None  Suicidal Thoughts:Suicidal Thoughts: No  Homicidal Thoughts:Homicidal Thoughts: No   Sensorium  Memory:Immediate Good; Recent Good  Judgment:Impaired  Insight:Shallow   Executive Functions  Concentration:Good  Attention Span:Good  [place] of Knowledge:Good  Language:Good   Psychomotor Activity  Psychomotor Activity:Psychomotor Activity: Normal   Assets  Assets:Resilience; Social Support   Sleep  Sleep:Sleep:  Fair    Physical Exam: Physical Exam Vitals and nursing note reviewed.  Constitutional:      General: She is not in acute distress.    Appearance: Normal appearance. She is normal weight. She is not ill-appearing or toxic-appearing.  HENT:     Head: Normocephalic and atraumatic.  Pulmonary:     Effort: Pulmonary effort is normal.  Musculoskeletal:        General: Normal range of motion.  Neurological:     General: No focal deficit present.     Mental Status: She is alert.   Review of Systems  Respiratory:  Negative for cough and shortness of breath.   Cardiovascular:  Negative for chest pain.  Gastrointestinal:  Negative for abdominal pain, constipation, diarrhea, nausea and vomiting.  Neurological:  Negative for weakness and headaches.  Psychiatric/Behavioral:  Positive for depression. Negative for hallucinations and suicidal ideas. The patient is not nervous/anxious.   Blood pressure (!) 167/93, pulse 62, temperature 98.2 F (36.8 C), temperature source Oral, resp. rate 15, height 4\' 11"  (1.499 m), weight 38.6 kg, SpO2 100 %. Body mass index is 17.21 kg/m.   Treatment Plan Summary: Daily contact with patient to assess and evaluate symptoms and progress in treatment and Medication management Laquanna Veazey is a 70 yo patient w/ PPH of MDD who presented under IVC after Suicide Attempt via Overdose.    Patient continues to minimize and ruminate about her current situation.  She has gained some insight in that she know she needs help and is wanting it but is still resistant to change.  After having extensive discussion about both ECT and [place] patient states that this time she is not interested and does not want her medications change at this time.  If there is no improvement by tomorrow will continue to ask about increasing her Lexapro.  She was placed on a room lockout order today.  We will continue to monitor.   MDD, recurrent severe w/o psychosis (r/o pseudodementia vs Mild  Neurocognitive disorder) -Continue IVC -Continue Lexapro 10mg  (monitoring BMP given past h/o hyponatremia on past SSRI trials) -Continue Remeron 7.5mg  QHS -Continue Zyprexa Zydis 2.5mg  QHS -Continue room lock out during the day -Continue Melatonin 3 mg po PRN QHS -Continue Vistaril 10mg  tid PRN anxiety -Continue to discuss ECT/TMS for consideration    Possible  benzodiazepine withdrawal -CIWA has been 0 for multiple days -Will not restart home Klonopin at this time.      HTN,hx -Continue Amlodipine 10 mg daily -Continue Atenolol 50 mg BID -Continue Lisinopril 20 mg daily    HLD: -Continue Lipitor 20 mg daily    Nutrition: -Continue Ergocalciferol 50 000 units PO weekly    -Continue PRN's: Tylenol, Maalox, Atarax, Milk of Magnesia   Briant Cedar, MD 08/19/2021, 5:47 PM

## 2021-08-20 LAB — VITAMIN B1: Vitamin B1 (Thiamine): 156.6 nmol/L (ref 66.5–200.0)

## 2021-08-20 NOTE — Group Note (Signed)
Date:  08/20/2021 Time:  9:40 AM  Group Topic/Focus:  Orientation:   The focus of this group is to educate the patient on the purpose and policies of crisis stabilization and provide a format to answer questions about their admission.  The group details unit policies and expectations of patients while admitted.    Participation Level:  Active  Participation Quality:  Appropriate  Affect:  Appropriate  Cognitive:  Appropriate  Insight: Appropriate  Engagement in Group:  Engaged  Modes of Intervention:  Discussion  Additional Comments:  Pt was to work on discharge planning.  Garvin Fila 08/20/2021, 9:40 AM

## 2021-08-20 NOTE — Progress Notes (Signed)
°   08/20/21 2225  Psych Admission Type (Psych Patients Only)  Admission Status Involuntary  Psychosocial Assessment  Patient Complaints Anxiety  Eye Contact Fair  Facial Expression Anxious  Affect Appropriate to circumstance  Speech Logical/coherent  Interaction Minimal  Motor Activity Slow  Appearance/Hygiene Unremarkable  Behavior Characteristics Appropriate to situation  Mood Pleasant  Thought Process  Coherency WDL  Content WDL  Delusions None reported or observed  Perception WDL  Hallucination None reported or observed  Judgment Impaired  Confusion None  Danger to Self  Current suicidal ideation? Denies  Danger to Others  Danger to Others None reported or observed

## 2021-08-20 NOTE — Progress Notes (Signed)
[facility] MD Progress Note  08/20/2021 11:29 AM Jenna [name]  MRN:  [id number] Subjective:   Jenna [name] is a 70 yo patient w/ PPH of MDD who presented under IVC after Suicide Attempt via Overdose.     Case was discussed in the multidisciplinary team. MAR was reviewed and patient was compliant with medications.  She did not require any PRN's for agitation.    Psychiatric Team made the following recommendations yesterday:  -Continue IVC -Continue Lexapro` 10mg  daily -Continue Remeron 7.5mg  QHS -Continue Zyprexa Zydis 2.5mg  QHS -Continue Room Lockout order    On interview today patient reports she is doing ok today.  She reports her sleep was not great last night as she woke up around 1 AM for no reason.  She reports that her appetite continues to be decreased but she is drinking the Boosts shakes when they are given to her.  She reports no SI, HI, or AVH.  When asked how her mood is today she reports - Hopeful.  When asked what she is looking forward to she reports she wants to get back into painting, doing yoga, and going to lunch with her friends.  She reports these are all things she stopped doing as her depression worsened.  She reports she is looking forward to feeling like herself again.  She reports that she was initially hesitant to attend groups because she is so introverted but that she has really got significant benefit from them.  She reports specifically that she realizes she is not alone in how she is feeling.  She reports her Depression today is a 1 out of 10 (10 being the worst).  She reports no issues with her medications.  She reports no other concerns at present.   Principal Problem: MDD (major depressive disorder), recurrent episode, severe ([place]) Diagnosis: Principal Problem:   MDD (major depressive disorder), recurrent episode, severe ([place]) Active Problems:   Anxiety   Malnourished ([place])   Vitamin D deficiency  Total Time spent with patient: 15 minutes  Past  Psychiatric History: MDD  Past Medical History:  Past Medical History:  Diagnosis Date   Anxiety    Hypertension    Hyponatremia    History reviewed. No pertinent surgical history. Family History:  Family History  Problem Relation Age of Onset   Hypertension Mother    Family Psychiatric  History: Mother - Depression and Anxiety Social History:  Social History   Substance and Sexual Activity  Alcohol Use Not Currently   Alcohol/week: 0.0 standard drinks     Social History   Substance and Sexual Activity  Drug Use Not Currently    Social History   Socioeconomic History   Marital status: Married    Spouse name: Not on file   Number of children: Not on file   Years of education: Not on file   Highest education level: Not on file  Occupational History   Not on file  Tobacco Use   Smoking status: Never   Smokeless tobacco: Never  Substance and Sexual Activity   Alcohol use: Not Currently    Alcohol/week: 0.0 standard drinks   Drug use: Not Currently   Sexual activity: Yes  Other Topics Concern   Not on file  Social History Narrative   Not on file   Social Determinants of Health   Financial Resource Strain: Not on file  Food Insecurity: Not on file  Transportation Needs: Not on file  Physical Activity: Not on file  Stress: Not on  file  Social Connections: Not on file   Additional Social History:                         Sleep: Fair  Appetite:  Poor  Current Medications: Current Facility-Administered Medications  Medication Dose Route Frequency Provider Last Rate Last Admin   acetaminophen (TYLENOL) tablet 650 mg  650 mg Oral Q6H PRN Starkes-Perry, Gayland Curry, FNP       alum & mag hydroxide-simeth (MAALOX/MYLANTA) 200-200-20 MG/5ML suspension 30 mL  30 mL Oral Q4H PRN Starkes-Perry, Gayland Curry, FNP       amLODipine (NORVASC) tablet 10 mg  10 mg Oral Daily Suella Broad, FNP   10 mg at 08/20/21 0743   atenolol (TENORMIN) tablet 50 mg  50 mg  Oral BID Suella Broad, FNP   50 mg at 08/19/21 1654   atorvastatin (LIPITOR) tablet 20 mg  20 mg Oral Daily Suella Broad, FNP   20 mg at 08/20/21 0743   calcium-vitamin D (OSCAL WITH D) 500-5 MG-MCG per tablet 1 tablet  1 tablet Oral Daily Suella Broad, FNP   1 tablet at 08/20/21 0743   escitalopram (LEXAPRO) tablet 10 mg  10 mg Oral Daily Damita Dunnings B, MD   10 mg at 08/20/21 0743   feeding supplement (BOOST / RESOURCE BREEZE) liquid 1 Container  1 Container Oral Q24H Massengill, Ovid Curd, MD   1 Container at 08/19/21 1515   hydrOXYzine (ATARAX) tablet 10 mg  10 mg Oral TID PRN Damita Dunnings B, MD   10 mg at 08/16/21 1030   lisinopril (ZESTRIL) tablet 20 mg  20 mg Oral Daily Suella Broad, FNP   20 mg at 08/20/21 0743   magnesium hydroxide (MILK OF MAGNESIA) suspension 30 mL  30 mL Oral Daily PRN Starkes-Perry, Gayland Curry, FNP       melatonin tablet 3 mg  3 mg Oral QHS PRN Nelda Marseille, Amy E, MD       mirtazapine (REMERON) tablet 7.5 mg  7.5 mg Oral QHS Hill, Jackie Plum, MD   7.5 mg at 08/19/21 2000   multivitamin with minerals tablet 1 tablet  1 tablet Oral Daily Harlow Asa, MD   1 tablet at 08/20/21 0742   OLANZapine (ZYPREXA) tablet 2.5 mg  2.5 mg Oral QHS Damita Dunnings B, MD   2.5 mg at 08/19/21 2000   Vitamin D (Ergocalciferol) (DRISDOL) capsule 50,000 Units  50,000 Units Oral Q7 days Maida Sale, MD   50,000 Units at 08/16/21 2053    Lab Results: No results found for this or any previous visit (from the past 48 hour(s)).  Blood Alcohol level:  Lab Results  Component Value Date   ETH <10 02/63/7858    Metabolic Disorder Labs: Lab Results  Component Value Date   HGBA1C 5.4 08/10/2021   MPG 108.28 08/10/2021   No results found for: PROLACTIN Lab Results  Component Value Date   CHOL 141 08/10/2021   TRIG 82 08/10/2021   HDL 59 08/10/2021   CHOLHDL 2.4 08/10/2021   VLDL 16 08/10/2021   LDLCALC 66 08/10/2021    Physical  Findings: AIMS: Facial and Oral Movements Muscles of Facial Expression: None, normal Lips and Perioral Area: None, normal Jaw: None, normal Tongue: None, normal,Extremity Movements Upper (arms, wrists, hands, fingers): None, normal Lower (legs, knees, ankles, toes): None, normal, Trunk Movements Neck, shoulders, hips: None, normal, Overall Severity Severity of abnormal movements (highest score from  questions above): None, normal Incapacitation due to abnormal movements: None, normal Patient's awareness of abnormal movements (rate only patient's report): No Awareness, Dental Status Current problems with teeth and/or dentures?: No Does patient usually wear dentures?: No  CIWA:  CIWA-Ar Total: 1 COWS:  COWS Total Score: 2  Musculoskeletal: Strength & Muscle Tone: within normal limits Gait & Station: normal Patient leans: N/A  Psychiatric Specialty Exam:  Presentation  General Appearance: Appropriate for Environment; Casual  Eye Contact:Good  Speech:Clear and Coherent; Normal Rate  Speech Volume:Normal  Handedness:No data recorded  Mood and Affect  Mood:-- ("hopeful")  Affect:Congruent; Appropriate   Thought Process  Thought Processes:Coherent; Goal Directed  Descriptions of Associations:Intact  Orientation:Full (Time, Place and Person)  Thought Content:Logical  History of Schizophrenia/Schizoaffective disorder:No  Duration of Psychotic Symptoms:No data recorded Hallucinations:Hallucinations: None  Ideas of Reference:None  Suicidal Thoughts:Suicidal Thoughts: No  Homicidal Thoughts:Homicidal Thoughts: No   Sensorium  Memory:Immediate Good; Recent Good  Judgment:Fair  Insight:Fair   Executive Functions  Concentration:Good  Attention Span:Good  Recall:Good  Fund of Knowledge:Good  Language:Good   Psychomotor Activity  Psychomotor Activity:Psychomotor Activity: Normal   Assets  Assets:Desire for Improvement; Resilience; Social  Support   Sleep  Sleep:Sleep: Fair    Physical Exam: Physical Exam Vitals and nursing note reviewed.  Constitutional:      General: She is not in acute distress.    Appearance: Normal appearance. She is not ill-appearing or toxic-appearing.  HENT:     Head: Normocephalic and atraumatic.  Pulmonary:     Effort: Pulmonary effort is normal.  Musculoskeletal:        General: Normal range of motion.  Neurological:     General: No focal deficit present.     Mental Status: She is alert.   Review of Systems  Respiratory:  Negative for cough and shortness of breath.   Cardiovascular:  Negative for chest pain.  Gastrointestinal:  Negative for abdominal pain, constipation, diarrhea, nausea and vomiting.  Neurological:  Negative for weakness and headaches.  Psychiatric/Behavioral:  Negative for depression, hallucinations and suicidal ideas. The patient is not nervous/anxious.   Blood pressure 135/77, pulse (!) 58, temperature 98.4 F (36.9 C), temperature source Oral, resp. rate 14, height 4\' 11"  (1.499 m), weight 38.6 kg, SpO2 98 %. Body mass index is 17.21 kg/m.   Treatment Plan Summary: Daily contact with patient to assess and evaluate symptoms and progress in treatment and Medication management Jenna [name] is a 70 yo patient w/ PPH of MDD who presented under IVC after Suicide Attempt via Overdose.    She is opening up and is actively engaged in her care.  She is getting benefit from groups and is future forward to activities (once discharge) again.  We will not make any changes to her medications at this time. Her appetite continues to be an issue but she is drinking the Boosts when given to her.  We will continue to monitor.    MDD, recurrent severe w/o psychosis (r/o pseudodementia vs Mild Neurocognitive disorder) -Continue IVC -Continue Lexapro 10mg  (monitoring BMP given past h/o hyponatremia on past SSRI trials) -Continue Remeron 7.5mg  QHS -Continue Zyprexa Zydis 2.5mg   QHS -Continue room lock out during the day -Continue Melatonin 3 mg po PRN QHS -Continue Vistaril 10mg  tid PRN anxiety -Continue to discuss ECT/TMS for consideration    Possible benzodiazepine withdrawal -CIWA has been 0 for multiple days -Will not restart home Klonopin at this time.      HTN: -Continue Amlodipine 10 mg daily -  Continue Atenolol 50 mg BID -Continue Lisinopril 20 mg daily    HLD: -Continue Lipitor 20 mg daily    Nutrition: -Continue Ergocalciferol 50 000 units PO weekly    -Continue PRN's: Tylenol, Maalox, Atarax, Milk of Magnesia   Briant Cedar, MD 08/20/2021, 11:29 AM

## 2021-08-20 NOTE — [facility] Group Notes (Signed)
Psychoeducational Group Note  Date:  08/06/2021 Time:  1300-1400   Group Topic/Focus: This is a continuation of the group from Saturday. Pt's have been asked to formulate a list of 30 positives about themselves. This list is to be read 2 times a day for 30 days, looking in a mirror. Changing patterns of negative self talk. Also discussed is the fact that there have been some people who hurt [place] in the past. We keep that memory alive within [place]. Ways to cope with this are discused   Participation Level:  Active  Participation Quality:  Appropriate  Affect:  Appropriate  Cognitive:  Oriented  Insight: Improving  Engagement in Group:  Engaged  Modes of Intervention:  Activity, Discussion, Education, and Support  Additional Comments:  Pt attended the group. Rates her energy at an 8/10. Stopped this Probation officer after group and said that she appreciated the weekend and was grateful for the groups  Bryson Dames A

## 2021-08-20 NOTE — Progress Notes (Signed)
°   08/20/21 0002  Psych Admission Type (Psych Patients Only)  Admission Status Involuntary  Psychosocial Assessment  Patient Complaints Anxiety  Eye Contact Fair  Facial Expression Anxious  Affect Appropriate to circumstance  Speech Logical/coherent  Interaction Minimal  Motor Activity Slow  Appearance/Hygiene Unremarkable  Behavior Characteristics Appropriate to situation  Mood Anxious  Thought Process  Coherency WDL  Content WDL  Delusions None reported or observed  Perception WDL  Hallucination None reported or observed  Judgment Impaired  Confusion None  Danger to Self  Current suicidal ideation? Denies  Danger to Others  Danger to Others None reported or observed

## 2021-08-20 NOTE — Progress Notes (Addendum)
°   08/20/21 1500  Psych Admission Type (Psych Patients Only)  Admission Status Involuntary  Psychosocial Assessment  Patient Complaints Anxiety  Eye Contact Fair  Facial Expression Anxious  Affect Appropriate to circumstance  Speech Logical/coherent  Interaction Minimal  Motor Activity Slow  Appearance/Hygiene Unremarkable  Behavior Characteristics Cooperative;Appropriate to situation  Mood Anxious  Aggressive Behavior  Targets Family  Type of Behavior Threatening;Striking out  Effect No apparent injury  Thought Process  Coherency WDL  Content WDL  Delusions None reported or observed  Perception WDL  Hallucination None reported or observed  Judgment Impaired  Confusion None  Danger to Self  Current suicidal ideation? Denies  Danger to Others  Danger to Others None reported or observed   D. Pt presents with an anxious affect/ improved mood, brightens upon approach. Pt endorsed poor sleep/ poor concentration today. Per pt's self inventory, pt rated her depression, hopelessness and anxiety 1/1/5, respectively. Pt has been visible in the milieu throughout the shift, and observed attending groups.Pt currently denies SI/HI and AVH  A. Labs and vitals monitored. Pt given and educated on medications. Pt supported emotionally and encouraged to express concerns and ask questions.   R. Pt remains safe with 15 minute checks. Will continue POC.

## 2021-08-20 NOTE — [facility] Group Notes (Signed)
Adult Psychoeducational Group Not Date:  08/20/2021 Time:  0900-1045 Group Topic/Focus: PROGRESSIVE RELAXATION. A group where deep breathing is taught and tensing and relaxation muscle groups is used. Imagery is used as well.  Pts are asked to imagine 3 pillars that hold them up when they are not able to hold themselves up.  Participation Level:  Active  Participation Quality:  Appropriate  Affect:  Appropriate  Cognitive:  Oriented  Insight: Improving  Engagement in Group:  Engaged  Modes of Intervention:  Activity, Discussion, Education, and Support  Additional Comments:  Rates her energy as a 9/10. Could not think of her pillars.  Jenna [name]

## 2021-08-20 NOTE — Progress Notes (Signed)
The focus of this group is to help patients review their daily goal of treatment and discuss progress on daily workbooks. Pt did not attend the evening group. 

## 2021-08-20 NOTE — Group Note (Signed)
[facility] LCSW Group Therapy Note  08/20/2021  10:30am-11:30am  Type of Therapy and Topic:  Group Therapy:  Adding Supports Including Yourself  Participation Level:  Minimal   Description of Group:   Patients in this group were introduced to the concept that additional supports including self-support are an essential part of recovery.  Patients listed their current healthy and unhealthy supports, and discussed the difference between the two.   Several songs were played and a group discussion ensued in which patients stated they could relate to the songs which inspired them to realize they have be willing to help themselves in order to succeed, because other people cannot achieve sobriety or stability for them.  Parents were encouraged toward self-advocacy and self-support as part of their recovery.  They discussed their reactions to these songs' messages, which were positive and hopeful.  Before group ended, they identified the supports they believe they need to add to their lives to achieve their goals at discharge.   Therapeutic Goals: 1)  explain the difference between healthy and unhealthy supports and discuss what specific supports are currently in patients' lives 2)  demonstrate the importance of being a key part of one's own support system 3)  discuss the need for appropriate boundaries with supports 4)  elicit ideas from patients about supports that need to be added in order to achieve goals   Summary of Patient Progress:   The patient listed current healthy supports as her daughter and sisters and current unhealthy supports as her husband.  The patient participated by listening only, did not talk at all during group.  Therapeutic Modalities:   Motivational Interviewing Activity  Maretta Los

## 2021-08-21 ENCOUNTER — Encounter (HOSPITAL_COMMUNITY): Payer: Self-pay

## 2021-08-21 MED ORDER — OLANZAPINE 5 MG PO TABS
5.0000 mg | ORAL_TABLET | Freq: Every day | ORAL | Status: DC
  Administered 2021-08-21 – 2021-08-23 (×3): 5 mg via ORAL
  Filled 2021-08-21 (×5): qty 1

## 2021-08-21 NOTE — Group Note (Signed)
Recreation Therapy Group Note   Group Topic:Stress Management  Group Date: 08/21/2021 Start Time: 0930 End Time: 1000 Facilitators: Victorino Sparrow, [place] Location: [address] Dayroom   Goal Area(s) Addresses:  Patient will identify positive stress management techniques. Patient will identify benefits of using stress management post d/c.  Group Description: LRT played a meditation to help patients relax and lessen anxiety.  Patients were to listen and follow along as meditation played to gain its full benefits.  Clinical Observations/Individualized Feedback: Stress management group did not occur due to goals group running over to 0950.   Plan: Continue to engage patient in RT group sessions 2-3x/week.   Victorino Sparrow, LRT,CTRS 08/21/2021 1:07 PM

## 2021-08-21 NOTE — Group Note (Signed)
LCSW Group Therapy Note   Group Date: 08/21/2021 Start Time: 1300 End Time: 1400  Therapy Type: Group Therapy  Participation Level:  Active   Patients received a worksheet with an outline of 2 gingerbread men with a separation in the middle of the page. One sign designated what the pt sees about themselves and the other is what others see. Pts were asked to introduce themselves and share something they like about themself. Pts were then asked to draw, write or color how they view themselves as well as how they are viewed by others. CSW led discussion about the feelings and words associated with each side.   Patient Summary:   During introductions pt shared their name and stated they liked that they are artistic about themselves. Pt was appropriate and participated in group discussion.    [name], LCSWA 08/21/2021  1:23 PM

## 2021-08-21 NOTE — Progress Notes (Signed)
Patient did not attend morning orientation group.  

## 2021-08-21 NOTE — Progress Notes (Signed)
[facility] MD Progress Note  08/21/2021 1:06 PM Jenna [name]  MRN:  [id number] Subjective:  Jenna [name] is a 70 yo patient w/ PPH of MDD who presented under IVC after Suicide Attempt via Overdose.    Case was discussed in the multidisciplinary team. MAR was reviewed and patient was compliant with medications.  She did not require any PRN's for agitation.     Psychiatric Team made the following recommendations yesterday:  -Continue IVC -Continue Lexapro` 10mg  daily -Continue Remeron 7.5mg  QHS -Continue Zyprexa Zydis 2.5mg  QHS   Patient room lockout order has been held.  Patient has been noted by staff to attend groups without room lockout order in place.  On assessment this a.m. patient reports that her depression is a "8 out of 10."  Patient reports that on her scale a 10 is very good.  Patient endorses that she rates her depression a "8" because she has "more energy".  Patient endorses that is difficult for her to stay in the day room all day because she does not have much in common with many of the other patients, but she has been reading books that her family has brought her.  Patient reports that she has actually been enjoying going to group and is surprised by this.  Patient reports that she feels that she is learning from the group sessions and is participating.  Patient reports that over the weekend her husband did come to visit and she feels that the visit was "better."  Patient reports that she also feels that her husband is doing a bit better in regards to her health.  Patient reports that she still feels she is ruminating on her negative behaviors in her past and that this is making it difficult for her to move forward.  Patient reports that she feels she is sleeping better and is doing her best to eat, but does not like most of the food.  Patient endorses that the food upsets her stomach on occasion.  Patient reports that she continues to look forward to painting 1 day and endorses that  when she is discharge she would like to go get supplies.  Patient denies SI, HI and AVH.  Spoke with husband: Plan to meet, Wed. Afternoon at  1:30 for a Family meeting. Husband reports that she is gradually improving. Husband feels she is less resistant and confrontational. Husband will contact patient's daughter to see if daughter will be able to attend family meeting.    Pat. Grandfather had ECT.  Principal Problem: MDD (major depressive disorder), recurrent episode, severe ([place]) Diagnosis: Principal Problem:   MDD (major depressive disorder), recurrent episode, severe ([place]) Active Problems:   Anxiety   Malnourished ([place])   Vitamin D deficiency  Total Time spent with patient: 15 minutes  Past Psychiatric History: See H&P  Past Medical History:  Past Medical History:  Diagnosis Date   Anxiety    Hypertension    Hyponatremia    History reviewed. No pertinent surgical history. Family History:  Family History  Problem Relation Age of Onset   Hypertension Mother    Family Psychiatric  History: Pat. Grandfather had ECT. (When ECT was first created), Sister is on Lexapro.  Social History:  Social History   Substance and Sexual Activity  Alcohol Use Not Currently   Alcohol/week: 0.0 standard drinks     Social History   Substance and Sexual Activity  Drug Use Not Currently    Social History   Socioeconomic History  Marital status: Married    Spouse name: Not on file   Number of children: Not on file   Years of education: Not on file   Highest education level: Not on file  Occupational History   Not on file  Tobacco Use   Smoking status: Never   Smokeless tobacco: Never  Substance and Sexual Activity   Alcohol use: Not Currently    Alcohol/week: 0.0 standard drinks   Drug use: Not Currently   Sexual activity: Yes  Other Topics Concern   Not on file  Social History Narrative   Not on file   Social Determinants of Health   Financial Resource Strain: Not on  file  Food Insecurity: Not on file  Transportation Needs: Not on file  Physical Activity: Not on file  Stress: Not on file  Social Connections: Not on file   Additional Social History:                         Sleep: Fair  Appetite:   Improving  Current Medications: Current Facility-Administered Medications  Medication Dose Route Frequency Provider Last Rate Last Admin   acetaminophen (TYLENOL) tablet 650 mg  650 mg Oral Q6H PRN Starkes-Perry, Gayland Curry, FNP       alum & mag hydroxide-simeth (MAALOX/MYLANTA) 200-200-20 MG/5ML suspension 30 mL  30 mL Oral Q4H PRN Starkes-Perry, Gayland Curry, FNP       amLODipine (NORVASC) tablet 10 mg  10 mg Oral Daily Suella Broad, FNP   10 mg at 08/21/21 0752   atenolol (TENORMIN) tablet 50 mg  50 mg Oral BID Suella Broad, FNP   50 mg at 08/21/21 0752   atorvastatin (LIPITOR) tablet 20 mg  20 mg Oral Daily Suella Broad, FNP   20 mg at 08/21/21 4696   calcium-vitamin D (OSCAL WITH D) 500-5 MG-MCG per tablet 1 tablet  1 tablet Oral Daily Suella Broad, FNP   1 tablet at 08/21/21 0751   escitalopram (LEXAPRO) tablet 10 mg  10 mg Oral Daily Damita Dunnings B, MD   10 mg at 08/21/21 0751   feeding supplement (BOOST / RESOURCE BREEZE) liquid 1 Container  1 Container Oral Q24H Massengill, Ovid Curd, MD   1 Container at 08/19/21 1515   hydrOXYzine (ATARAX) tablet 10 mg  10 mg Oral TID PRN Damita Dunnings B, MD   10 mg at 08/21/21 0754   lisinopril (ZESTRIL) tablet 20 mg  20 mg Oral Daily Suella Broad, FNP   20 mg at 08/21/21 0754   magnesium hydroxide (MILK OF MAGNESIA) suspension 30 mL  30 mL Oral Daily PRN Starkes-Perry, Gayland Curry, FNP       melatonin tablet 3 mg  3 mg Oral QHS PRN Nelda Marseille, Amy E, MD       mirtazapine (REMERON) tablet 7.5 mg  7.5 mg Oral QHS Hill, Jackie Plum, MD   7.5 mg at 08/20/21 1958   multivitamin with minerals tablet 1 tablet  1 tablet Oral Daily Harlow Asa, MD   1 tablet at  08/21/21 0752   OLANZapine (ZYPREXA) tablet 5 mg  5 mg Oral QHS Freida Busman, MD       Vitamin D (Ergocalciferol) (DRISDOL) capsule 50,000 Units  50,000 Units Oral Q7 days Maida Sale, MD   50,000 Units at 08/16/21 2053    Lab Results: No results found for this or any previous visit (from the past 48 hour(s)).  Blood Alcohol  level:  Lab Results  Component Value Date   ETH <10 83/41/9622    Metabolic Disorder Labs: Lab Results  Component Value Date   HGBA1C 5.4 08/10/2021   MPG 108.28 08/10/2021   No results found for: PROLACTIN Lab Results  Component Value Date   CHOL 141 08/10/2021   TRIG 82 08/10/2021   HDL 59 08/10/2021   CHOLHDL 2.4 08/10/2021   VLDL 16 08/10/2021   LDLCALC 66 08/10/2021    Physical Findings: AIMS: Facial and Oral Movements Muscles of Facial Expression: None, normal Lips and Perioral Area: None, normal Jaw: None, normal Tongue: None, normal,Extremity Movements Upper (arms, wrists, hands, fingers): None, normal Lower (legs, knees, ankles, toes): None, normal, Trunk Movements Neck, shoulders, hips: None, normal, Overall Severity Severity of abnormal movements (highest score from questions above): None, normal Incapacitation due to abnormal movements: None, normal Patient's awareness of abnormal movements (rate only patient's report): No Awareness, Dental Status Current problems with teeth and/or dentures?: No Does patient usually wear dentures?: No  CIWA:  CIWA-Ar Total: 1 COWS:  COWS Total Score: 2  Musculoskeletal: Strength & Muscle Tone: within normal limits Gait & Station: normal Patient leans: N/A  Psychiatric Specialty Exam:  Presentation  General Appearance: Appropriate for Environment; Casual  Eye Contact:Good  Speech:Clear and Coherent; Normal Rate  Speech Volume:Normal  Handedness:No data recorded  Mood and Affect  Mood:-- ("hopeful")  Affect:Congruent; Appropriate   Thought Process  Thought  Processes:Coherent; Goal Directed  Descriptions of Associations:Intact  Orientation:Full (Time, Place and Person)  Thought Content:Logical  History of Schizophrenia/Schizoaffective disorder:No  Duration of Psychotic Symptoms:No data recorded Hallucinations:Hallucinations: None  Ideas of Reference:None  Suicidal Thoughts:Suicidal Thoughts: No  Homicidal Thoughts:Homicidal Thoughts: No   Sensorium  Memory:Immediate Good; Recent Good  Judgment:Fair  Insight:Fair   Executive Functions  Concentration:Good  Attention Span:Good  Recall:Good  Fund of Knowledge:Good  Language:Good   Psychomotor Activity  Psychomotor Activity:Psychomotor Activity: Normal   Assets  Assets:Desire for Improvement; Resilience; Social Support   Sleep  Sleep:Sleep: Fair    Physical Exam: Physical Exam Constitutional:      Appearance: Normal appearance.  HENT:     Head: Normocephalic and atraumatic.  Eyes:     Extraocular Movements: Extraocular movements intact.  Neurological:     Mental Status: She is alert.   Review of Systems  Gastrointestinal:  Negative for abdominal pain.  Psychiatric/Behavioral:  Negative for hallucinations and suicidal ideas.   Blood pressure 120/70, pulse 67, temperature 98.1 F (36.7 C), temperature source Oral, resp. rate 14, height 4\' 11"  (1.499 m), weight 38.6 kg, SpO2 98 %. Body mass index is 17.21 kg/m.   Treatment Plan Summary: Daily contact with patient to assess and evaluate symptoms and progress in treatment and Medication management Shenia Alan is a 70 yo patient w/ PPH of MDD who presented under IVC after Suicide Attempt via Overdose.  Patient is still guarded and restrictive to change but is slowly working to push herself to change her habits.  Patient's thought process continues to be negative overall, however, patient is more vocal and receptive when external voices acknowledge this.  Patient's hygiene is getting better, and patient  is remaining compliant with her medications; however there remains concern for patient's consistency.  ECT would likely be the best option for patient.  Patient's family have requested a family meeting to discuss treatment moving forward.  This seems appropriate given patient's longevity of symptoms and current age.  MDD, recurrent severe w/o psychosis (r/o pseudodementia vs Mild Neurocognitive  disorder) -Continue IVC -Continue Lexapro 10mg  (monitoring BMP given past h/o hyponatremia on past SSRI trials) -Continue Remeron 7.5mg  QHS -Increase Zyprexa Zydis 5 mg QHS -Continue room lock out during the day -Continue Melatonin 3 mg po PRN QHS -Continue Vistaril 10mg  tid PRN anxiety -Continue to discuss ECT/TMS for consideration - Social work to reach out for [facility] referral - Wednesday at 1: 53, family meeting     Possible benzodiazepine withdrawal -CIWA has been 0 for multiple days -Will not restart home Klonopin at this time.       HTN: -Continue Amlodipine 10 mg daily -Continue Atenolol 50 mg BID -Continue Lisinopril 20 mg daily     HLD: -Continue Lipitor 20 mg daily     Nutrition: -Continue Ergocalciferol 50 000 units PO weekly     PGY-2 Freida Busman, MD 08/21/2021, 1:06 PM

## 2021-08-21 NOTE — [facility] Counselor (Addendum)
Jenna [name], from [employer], requested letter for continuation of IVC for this patient. CSW sent letter with provider signature via fax to Jenna [name] at (f) [phone].    Jenna Sargent, LCSW, [place] Social Worker  [facility]

## 2021-08-21 NOTE — BH IP Treatment Plan (Signed)
Interdisciplinary Treatment and Diagnostic Plan Update  08/21/2021 Time of Session: 9:15am MOE BRIER MRN: [id number]  Principal Diagnosis: MDD (major depressive disorder), recurrent episode, severe ([place])  Secondary Diagnoses: Principal Problem:   MDD (major depressive disorder), recurrent episode, severe ([place]) Active Problems:   Anxiety   Malnourished ([place])   Vitamin D deficiency   Current Medications:  Current Facility-Administered Medications  Medication Dose Route Frequency Provider Last Rate Last Admin   acetaminophen (TYLENOL) tablet 650 mg  650 mg Oral Q6H PRN Starkes-Perry, Gayland Curry, FNP       alum & mag hydroxide-simeth (MAALOX/MYLANTA) 200-200-20 MG/5ML suspension 30 mL  30 mL Oral Q4H PRN Starkes-Perry, Gayland Curry, FNP       amLODipine (NORVASC) tablet 10 mg  10 mg Oral Daily Suella Broad, FNP   10 mg at 08/21/21 0752   atenolol (TENORMIN) tablet 50 mg  50 mg Oral BID Suella Broad, FNP   50 mg at 08/21/21 0752   atorvastatin (LIPITOR) tablet 20 mg  20 mg Oral Daily Suella Broad, FNP   20 mg at 08/21/21 2505   calcium-vitamin D (OSCAL WITH D) 500-5 MG-MCG per tablet 1 tablet  1 tablet Oral Daily Suella Broad, FNP   1 tablet at 08/21/21 0751   escitalopram (LEXAPRO) tablet 10 mg  10 mg Oral Daily Damita Dunnings B, MD   10 mg at 08/21/21 0751   feeding supplement (BOOST / RESOURCE BREEZE) liquid 1 Container  1 Container Oral Q24H Massengill, Ovid Curd, MD   1 Container at 08/19/21 1515   hydrOXYzine (ATARAX) tablet 10 mg  10 mg Oral TID PRN Damita Dunnings B, MD   10 mg at 08/21/21 0754   lisinopril (ZESTRIL) tablet 20 mg  20 mg Oral Daily Suella Broad, FNP   20 mg at 08/21/21 0754   magnesium hydroxide (MILK OF MAGNESIA) suspension 30 mL  30 mL Oral Daily PRN Starkes-Perry, Gayland Curry, FNP       melatonin tablet 3 mg  3 mg Oral QHS PRN Nelda Marseille, Amy E, MD       mirtazapine (REMERON) tablet 7.5 mg  7.5 mg Oral QHS Hill, Jackie Plum, MD   7.5 mg at 08/20/21 1958   multivitamin with minerals tablet 1 tablet  1 tablet Oral Daily Harlow Asa, MD   1 tablet at 08/21/21 0752   OLANZapine (ZYPREXA) tablet 2.5 mg  2.5 mg Oral QHS Damita Dunnings B, MD   2.5 mg at 08/20/21 1958   Vitamin D (Ergocalciferol) (DRISDOL) capsule 50,000 Units  50,000 Units Oral Q7 days Maida Sale, MD   50,000 Units at 08/16/21 2053   PTA Medications: Medications Prior to Admission  Medication Sig Dispense Refill Last Dose   acetaminophen (TYLENOL) 325 MG tablet Take 2 tablets (650 mg total) by mouth every 6 (six) hours as needed for mild pain or fever (or Fever >/= 101). (Patient not taking: Reported on 08/10/2021) 12 tablet 0    amLODipine (NORVASC) 10 MG tablet Take 1 tablet (10 mg total) by mouth daily. For BP 30 tablet 3    atenolol (TENORMIN) 50 MG tablet Take 1 tablet (50 mg total) by mouth 2 (two) times daily. For BP 60 tablet 5    atorvastatin (LIPITOR) 20 MG tablet Take 20 mg by mouth daily.      busPIRone (BUSPAR) 15 MG tablet Take 15 mg by mouth 2 (two) times daily.      calcium-vitamin D (OSCAL WITH  D) 500-200 MG-UNIT tablet Take 1 tablet by mouth daily. (Patient not taking: Reported on 08/10/2021)      clonazePAM (KLONOPIN) 0.5 MG tablet Take 0.5-1 mg by mouth at bedtime.      denosumab (PROLIA) 60 MG/ML SOSY injection 60 mg every 6 (six) months.      FLUoxetine (PROZAC) 40 MG capsule Take 40 mg by mouth every morning.      lisinopril (ZESTRIL) 20 MG tablet Take 1 tablet (20 mg total) by mouth daily. For BP (Patient taking differently: Take 20 mg by mouth in the morning. For BP) 30 tablet 11     Patient Stressors: Financial difficulties   Health problems   Marital or family conflict    Patient Strengths: Supportive family/friends   Treatment Modalities: Medication Management, Group therapy, Case management,  1 to 1 session with clinician, Psychoeducation, Recreational therapy.   Physician Treatment Plan for Primary  Diagnosis: MDD (major depressive disorder), recurrent episode, severe ([place]) Long Term Goal(s): Improvement in symptoms so as ready for discharge   Short Term Goals: Ability to identify changes in lifestyle to reduce recurrence of condition will improve Ability to verbalize feelings will improve Ability to disclose and discuss suicidal ideas Ability to demonstrate self-control will improve Ability to identify and develop effective coping behaviors will improve Ability to maintain clinical measurements within normal limits will improve Compliance with prescribed medications will improve Ability to identify triggers associated with substance abuse/mental health issues will improve  Medication Management: Evaluate patient's response, side effects, and tolerance of medication regimen.  Therapeutic Interventions: 1 to 1 sessions, Unit Group sessions and Medication administration.  Evaluation of Outcomes: Progressing  Physician Treatment Plan for Secondary Diagnosis: Principal Problem:   MDD (major depressive disorder), recurrent episode, severe ([place]) Active Problems:   Anxiety   Malnourished ([place])   Vitamin D deficiency  Long Term Goal(s): Improvement in symptoms so as ready for discharge   Short Term Goals: Ability to identify changes in lifestyle to reduce recurrence of condition will improve Ability to verbalize feelings will improve Ability to disclose and discuss suicidal ideas Ability to demonstrate self-control will improve Ability to identify and develop effective coping behaviors will improve Ability to maintain clinical measurements within normal limits will improve Compliance with prescribed medications will improve Ability to identify triggers associated with substance abuse/mental health issues will improve     Medication Management: Evaluate patient's response, side effects, and tolerance of medication regimen.  Therapeutic Interventions: 1 to 1 sessions, Unit Group  sessions and Medication administration.  Evaluation of Outcomes: Progressing   RN Treatment Plan for Primary Diagnosis: MDD (major depressive disorder), recurrent episode, severe ([place]) Long Term Goal(s): Knowledge of disease and therapeutic regimen to maintain health will improve  Short Term Goals: Ability to remain free from injury will improve, Ability to verbalize frustration and anger appropriately will improve, Ability to demonstrate self-control, Ability to participate in decision making will improve, Ability to verbalize feelings will improve, Ability to identify and develop effective coping behaviors will improve, and Compliance with prescribed medications will improve  Medication Management: RN will administer medications as ordered by provider, will assess and evaluate patient's response and provide education to patient for prescribed medication. RN will report any adverse and/or side effects to prescribing provider.  Therapeutic Interventions: 1 on 1 counseling sessions, Psychoeducation, Medication administration, Evaluate responses to treatment, Monitor vital signs and CBGs as ordered, Perform/monitor CIWA, COWS, AIMS and Fall Risk screenings as ordered, Perform wound care treatments as ordered.  Evaluation  of Outcomes: Progressing   LCSW Treatment Plan for Primary Diagnosis: MDD (major depressive disorder), recurrent episode, severe ([place]) Long Term Goal(s): Safe transition to appropriate next level of care at discharge, Engage patient in therapeutic group addressing interpersonal concerns.  Short Term Goals: Engage patient in aftercare planning with referrals and resources, Increase social support, Increase ability to appropriately verbalize feelings, Increase emotional regulation, Facilitate acceptance of mental health diagnosis and concerns, Identify triggers associated with mental health/substance abuse issues, and Increase skills for wellness and recovery  Therapeutic  Interventions: Assess for all discharge needs, 1 to 1 time with Social worker, Explore available resources and support systems, Assess for adequacy in community support network, Educate family and significant other(s) on suicide prevention, Complete Psychosocial Assessment, Interpersonal group therapy.  Evaluation of Outcomes: Progressing   Progress in Treatment: Attending groups: Yes. Participating in groups: Yes. Taking medication as prescribed: Yes. Toleration medication: Yes. Family/Significant other contact made: Yes, individual(s) contacted:  Husband  Patient understands diagnosis: Yes. Discussing patient identified problems/goals with staff: Yes. Medical problems stabilized or resolved: Yes. Denies suicidal/homicidal ideation: Yes. Issues/concerns per patient self-inventory: No.     New problem(s) identified: No, Describe:  None   New Short Term/Long Term Goal(s): medication stabilization, elimination of SI thoughts, development of comprehensive mental wellness plan.    Patient Goals: "To be better"/Update    Discharge Plan or Barriers: Patient recently admitted. CSW will continue to follow and assess for appropriate referrals and possible discharge planning.    Reason for Continuation of Hospitalization: Depression Medication stabilization    Estimated Length of Stay: 3 to 5 days    Scribe for Treatment Team: Vassie Moselle, LCSW 08/21/2021 10:20 AM

## 2021-08-21 NOTE — BHH Counselor (Signed)
CSW sent a referral for Jenna [name] treatment. CSW also left a voicemail about the referral and requested a call back to get an appointment scheduled.    CSW also called [facility] in [place] to discuss outpatient options.  They provide geriatric outpatient services and in person PHP program.   They only have [name] services in [place], [place]. CSW scheduled an intake for in person PHP for December 27th at [facility].  The appointment would be for about 1 hour and patient will need to bring her ID and insurance card.    Jenna Carns, LCSW, [place] Social Worker  [facility]

## 2021-08-21 NOTE — Progress Notes (Signed)
Psychoeducational Group Note  Date:  08/21/2021 Time:  2129  Group Topic/Focus:  Relapse Prevention Planning:   The focus of this group is to define relapse and discuss the need for planning to combat relapse.  Participation Level: Did Not Attend  Participation Quality:  Not Applicable  Affect:  Not Applicable  Cognitive:  Not Applicable  Insight:  Not Applicable  Engagement in Group: Not Applicable  Additional Comments:  The patient did not attend group this evening.   Archie Balboa S 08/21/2021, 9:29 PM

## 2021-08-22 NOTE — Group Note (Signed)
Recreation Therapy Group Note   Group Topic:Animal Assisted Therapy   Group Date: 08/22/2021 Start Time: 1430 End Time: 1510 Facilitators: Victorino Sparrow, LRT,CTRS Location: [place]   AAA/T Program Assumption of Risk Form signed by Patient/ or Parent Legal Guardian Yes  Patient is free of allergies or severe asthma Yes  Patient reports no fear of animals Yes  Patient reports no history of cruelty to animals Yes  Patient understands his/her participation is voluntary Yes  Patient washes hands before animal contact Yes  Patient washes hands after animal contact Yes   Affect/Mood: Appropriate   Participation Level: Engaged   Participation Quality: Independent   Behavior: Appropriate   Speech/Thought Process: Focused   Insight: Good   Judgement: Good   Modes of Intervention: Nurse, adult   Patient Response to Interventions:  Attentive   Education Outcome:  Acknowledges education and In group clarification offered    Clinical Observations/Individualized Feedback:  Patient attended session and interacted appropriately with therapy dog and peers. Patient asked appropriate questions about therapy dog and his training. Patient shared stories about their pets at home with group.   Plan: Continue to engage patient in RT group sessions 2-3x/week.   Victorino Sparrow, Glennis Brink 08/22/2021 3:40 PM

## 2021-08-22 NOTE — Progress Notes (Signed)
[facility] Group Notes:  (Nursing/MHT/Case Management/Adjunct)  Date:  08/22/2021  Time:  2015 Type of Therapy:   wrap up group  Participation Level:  Active  Participation Quality:  Appropriate, Attentive, Sharing, and Supportive  Affect:  Anxious  Cognitive:  Alert  Insight:  Improving  Engagement in Group:  Engaged  Modes of Intervention:  Clarification, Education, and Support  Summary of Progress/Problems: Positive thinking and positive change were discussed.   Shellia Cleverly 08/22/2021, 9:29 PM

## 2021-08-22 NOTE — Progress Notes (Addendum)
[facility] MD Progress Note  08/22/2021 9:31 AM Jenna [name]  MRN:  [id number] Subjective:   Jenna [name] is a 70 yo patient w/ PPH of MDD who presented under IVC after Suicide Attempt via Overdose.    Case was discussed in the multidisciplinary team. MAR was reviewed and patient was compliant with medications.  She did not require any PRN's for agitation.     Psychiatric Team made the following recommendations yesterday:  Continue IVC -Continue Lexapro 10mg  (monitoring BMP given past h/o hyponatremia on past SSRI trials) -Continue Remeron 7.5mg  QHS -Increase Zyprexa Zydis 5 mg QHS -Continue room lock out during the day -Continue Melatonin 3 mg po PRN QHS -Continue Vistaril 10mg  tid PRN anxiety -Continue to discuss ECT/TMS for consideration - Social work to reach out for [facility] referral - Wednesday at 1: 46, family meeting   On assessment this a.m. patient is noted to have attempted to styled her hair and is up and walking on the unit.  Patient is also noted by provider early in the a.m. to be having conversations with other patients in her age range.  Patient reports that she believes she slept "much better" but is still feeling anxious today.  Patient reports that while she is anxious she still feels okay.  Patient reports that she feels the Zyprexa helped with her ruminative thoughts and endorses that she is now able to focus when attempting to read books that her family has brought her.  Patient reports that prior to today she was having trouble concentrating while reading.  Patient reports she overall feels safe on the unit but is still a bit hesitant to shower.  Patient endorsed that she continues to enjoy group and has been participating more per staff.  Patient reports she is aware of the family meeting scheduled for tomorrow afternoon and that there will be discussion about where she will go at discharge.  Patient reports that she would like to go home but she understands that this has been  difficult for her husband.  Objectively patient was noted by provider to be eating with other patients for the first time during lunch today.  Patient also has been noted to smile a bit more on the unit. Principal Problem: MDD (major depressive disorder), recurrent episode, severe ([place]) Diagnosis: Principal Problem:   MDD (major depressive disorder), recurrent episode, severe ([place]) Active Problems:   Anxiety   Malnourished ([place])   Vitamin D deficiency  Total Time spent with patient: 15 minutes  Past Psychiatric History: See H&P  Past Medical History:  Past Medical History:  Diagnosis Date   Anxiety    Hypertension    Hyponatremia    History reviewed. No pertinent surgical history. Family History:  Family History  Problem Relation Age of Onset   Hypertension Mother    Family Psychiatric  History: See H&P Social History:  Social History   Substance and Sexual Activity  Alcohol Use Not Currently   Alcohol/week: 0.0 standard drinks     Social History   Substance and Sexual Activity  Drug Use Not Currently    Social History   Socioeconomic History   Marital status: Married    Spouse name: Not on file   Number of children: Not on file   Years of education: Not on file   Highest education level: Not on file  Occupational History   Not on file  Tobacco Use   Smoking status: Never   Smokeless tobacco: Never  Substance and Sexual Activity  Alcohol use: Not Currently    Alcohol/week: 0.0 standard drinks   Drug use: Not Currently   Sexual activity: Yes  Other Topics Concern   Not on file  Social History Narrative   Not on file   Social Determinants of Health   Financial Resource Strain: Not on file  Food Insecurity: Not on file  Transportation Needs: Not on file  Physical Activity: Not on file  Stress: Not on file  Social Connections: Not on file   Additional Social History:                         Sleep: Fair  Appetite:  Fair  Current  Medications: Current Facility-Administered Medications  Medication Dose Route Frequency Provider Last Rate Last Admin   acetaminophen (TYLENOL) tablet 650 mg  650 mg Oral Q6H PRN Starkes-Perry, Gayland Curry, FNP       alum & mag hydroxide-simeth (MAALOX/MYLANTA) 200-200-20 MG/5ML suspension 30 mL  30 mL Oral Q4H PRN Starkes-Perry, Gayland Curry, FNP       amLODipine (NORVASC) tablet 10 mg  10 mg Oral Daily Suella Broad, FNP   10 mg at 08/22/21 0800   atenolol (TENORMIN) tablet 50 mg  50 mg Oral BID Suella Broad, FNP   50 mg at 08/22/21 0800   atorvastatin (LIPITOR) tablet 20 mg  20 mg Oral Daily Suella Broad, FNP   20 mg at 08/22/21 0800   calcium-vitamin D (OSCAL WITH D) 500-5 MG-MCG per tablet 1 tablet  1 tablet Oral Daily Suella Broad, FNP   1 tablet at 08/22/21 0800   escitalopram (LEXAPRO) tablet 10 mg  10 mg Oral Daily Damita Dunnings B, MD   10 mg at 08/22/21 0800   feeding supplement (BOOST / RESOURCE BREEZE) liquid 1 Container  1 Container Oral Q24H Massengill, Ovid Curd, MD   1 Container at 08/19/21 1515   hydrOXYzine (ATARAX) tablet 10 mg  10 mg Oral TID PRN Damita Dunnings B, MD   10 mg at 08/21/21 0754   lisinopril (ZESTRIL) tablet 20 mg  20 mg Oral Daily Suella Broad, FNP   20 mg at 08/22/21 0800   magnesium hydroxide (MILK OF MAGNESIA) suspension 30 mL  30 mL Oral Daily PRN Starkes-Perry, Gayland Curry, FNP       melatonin tablet 3 mg  3 mg Oral QHS PRN Nelda Marseille, Ebonee Stober E, MD       mirtazapine (REMERON) tablet 7.5 mg  7.5 mg Oral QHS Hill, Jackie Plum, MD   7.5 mg at 08/21/21 2021   multivitamin with minerals tablet 1 tablet  1 tablet Oral Daily Nelda Marseille, Jakavion Bilodeau E, MD   1 tablet at 08/22/21 0800   OLANZapine (ZYPREXA) tablet 5 mg  5 mg Oral QHS Damita Dunnings B, MD   5 mg at 08/21/21 2021   Vitamin D (Ergocalciferol) (DRISDOL) capsule 50,000 Units  50,000 Units Oral Q7 days Maida Sale, MD   50,000 Units at 08/16/21 2053    Lab Results: No  results found for this or any previous visit (from the past 48 hour(s)).  Blood Alcohol level:  Lab Results  Component Value Date   ETH <10 25/36/6440    Metabolic Disorder Labs: Lab Results  Component Value Date   HGBA1C 5.4 08/10/2021   MPG 108.28 08/10/2021   No results found for: PROLACTIN Lab Results  Component Value Date   CHOL 141 08/10/2021   TRIG 82 08/10/2021   HDL  59 08/10/2021   CHOLHDL 2.4 08/10/2021   VLDL 16 08/10/2021   LDLCALC 66 08/10/2021    Physical Findings: AIMS: Facial and Oral Movements Muscles of Facial Expression: None, normal Lips and Perioral Area: None, normal Jaw: None, normal Tongue: None, normal,Extremity Movements Upper (arms, wrists, hands, fingers): None, normal Lower (legs, knees, ankles, toes): None, normal, Trunk Movements Neck, shoulders, hips: None, normal, Overall Severity Severity of abnormal movements (highest score from questions above): None, normal Incapacitation due to abnormal movements: None, normal Patient's awareness of abnormal movements (rate only patient's report): No Awareness, Dental Status Current problems with teeth and/or dentures?: No Does patient usually wear dentures?: No  CIWA:  CIWA-Ar Total: 1 COWS:  COWS Total Score: 2  Musculoskeletal: Strength & Muscle Tone: within normal limits Gait & Station: normal Patient leans: N/A  Psychiatric Specialty Exam:  Presentation  General Appearance: casually dressed, improved hygiene, hair styled  Eye Contact:Good  Speech:Clear and Coherent  Speech Volume:Normal  Handedness:No data recorded  Mood and Affect  Mood:Anxious  Affect:brighter affect, can smile some today - less anxious appearing   Thought Process  Thought Processes:Goal Directed  Descriptions of Associations:intact  Orientation:Full (Time, Place and Person)  Thought Content:Logical - no evidence of delusions, paranoia, or AVH  History of Schizophrenia/Schizoaffective  disorder:No  Duration of Psychotic Symptoms:No data recorded Hallucinations:Hallucinations: None  Ideas of Reference:None  Suicidal Thoughts:Suicidal Thoughts: No  Homicidal Thoughts:Homicidal Thoughts: No   Sensorium  Memory:Immediate Good; Recent Fair  Judgment:-- (improving)  Insight:Shallow   Executive Functions  Concentration:Fair  Attention Span:Fair  [place] of Knowledge:Good  Language:Good   Psychomotor Activity  Psychomotor Activity:Psychomotor Activity: Normal No cogwheeling, no stiffness, no tremor, AIMS 0 today  Assets  Assets:Social Support; Resilience   Sleep  Sleep: time unrecorded   Physical Exam HENT:     Head: Normocephalic and atraumatic.  Pulmonary:     Effort: Pulmonary effort is normal.  Neurological:     Mental Status: She is alert and oriented to person, place, and time.   Review of Systems  Respiratory:  Negative for shortness of breath.   Cardiovascular:  Negative for chest pain.  Psychiatric/Behavioral:  Negative for hallucinations and suicidal ideas. The patient is nervous/anxious.   Blood pressure 125/66, pulse (!) 58, temperature 99 F (37.2 C), temperature source Oral, resp. rate 14, height 4\' 11"  (1.499 m), weight 38.6 kg, SpO2 100 %. Body mass index is 17.21 kg/m.   Treatment Plan Summary: Daily contact with patient to assess and evaluate symptoms and progress in treatment and Medication management Adalin Vanderploeg is a 70 yo patient w/ PPH of MDD who presented under IVC after Suicide Attempt via Overdose.  Patient appears to be improving.  Patient is slowly becoming less guarded both on the unit and during assessments.  Patient continues to endorse anxiety however, patient is feeling more able to handle anxiety without medication and has been participating more in group therapy sessions.  Patient appears to be less rigid in her thought.  MDD, recurrent severe - with questionable psychotic features of paranoia  (r/o pseudodementia vs Mild Neurocognitive disorder) -Continue IVC -Continue Lexapro 10mg  (monitoring BMP given past h/o hyponatremia on past SSRI trials) -Continue Remeron 7.5mg  QHS -Continue Zyprexa Zydis 5 mg QHS for questionable paranoia and ruminating thoughts (QTC 45ms, A1c 5.4, and lipids WNL) - Repeating EKG for QTC monitoring while on Zyprexa -Continue room lock out during the day -Continue Melatonin 3 mg po PRN QHS -Continue Vistaril 10mg  tid PRN anxiety -  Continue to discuss ECT/TMS for consideration - Social work to reach out for [facility] referral - Wednesday at 1: 88, family meeting       HTN: -Continue Amlodipine 10 mg daily -Continue Atenolol 50 mg BID -Continue Lisinopril 20 mg daily     HLD: -Continue Lipitor 20 mg daily     Nutrition: -Continue Ergocalciferol 50 000 units PO weekly    PGY-2 Freida Busman, MD 08/22/2021, 9:31 AM

## 2021-08-22 NOTE — Progress Notes (Signed)
°   08/22/21 1030  Charting Type  Charting Type Shift assessment  Assessment of needs addressed Yes  Orders Chart Check (once per shift) Completed  Safety Check Verification  Has the RN verified the 15 minute safety check completion? Yes  Neurological  Neuro (WDL) WDL  HEENT  HEENT (WDL) WDL  Respiratory  Respiratory (WDL) WDL  Cardiac  Cardiac (WDL) WDL  Vascular  Vascular (WDL) WDL  Integumentary  Integumentary (WDL) WDL  Braden Scale (Ages 8 and up)  Sensory Perceptions 4  Moisture 4  Activity 4  Mobility 4  Nutrition 3  Friction and Shear 3  Braden Scale Score 22  Musculoskeletal  Musculoskeletal (WDL) WDL  Assistive Device None  Gastrointestinal  Gastrointestinal (WDL) WDL  GU Assessment  Genitourinary (WDL) WDL  Neurological  Level of Consciousness Alert

## 2021-08-22 NOTE — Progress Notes (Signed)
°   08/21/21 2020  Psych Admission Type (Psych Patients Only)  Admission Status Involuntary  Psychosocial Assessment  Patient Complaints Anxiety  Eye Contact Fair  Facial Expression Anxious  Affect Appropriate to circumstance  Speech Logical/coherent  Interaction Minimal  Motor Activity Slow  Appearance/Hygiene Unremarkable  Behavior Characteristics Cooperative;Appropriate to situation;Anxious  Mood Anxious  Thought Process  Coherency WDL  Content WDL  Delusions None reported or observed  Perception WDL  Hallucination None reported or observed  Judgment Limited  Confusion None  Danger to Self  Current suicidal ideation? Denies  Danger to Others  Danger to Others None reported or observed

## 2021-08-23 DIAGNOSIS — F333 Major depressive disorder, recurrent, severe with psychotic symptoms: Secondary | ICD-10-CM

## 2021-08-23 NOTE — Progress Notes (Signed)
Pt did not participate in the NA group tonight. Pt did get snacks and watched the movie that was playing after NA group was over.

## 2021-08-23 NOTE — Group Note (Signed)
Recreation Therapy Group Note   Group Topic:Stress Management  Group Date: 08/23/2021 Start Time: 0930 End Time: 0945 Facilitators: Victorino Sparrow, [place] Location: [address] Dayroom   Goal Area(s) Addresses:  Patient will identify positive stress management techniques. Patient will identify benefits of using stress management post d/c.  Group Description: LRT explained the technique of meditation to patients.  LRT went on to play a meditation that focused on showing more compassion to ones self.  Patients were to listen and follow along as meditation played to fully engage in activity.   Affect/Mood: N/A   Participation Level: Did not attend    Clinical Observations/Individualized Feedback:     Plan: Continue to engage patient in RT group sessions 2-3x/week.   Victorino Sparrow, LRT,CTRS  08/23/2021 11:21 AM

## 2021-08-23 NOTE — [facility] Group Notes (Signed)
Pt attended morning goals group and stated that her goal for today was to work on her social skills.

## 2021-08-23 NOTE — Progress Notes (Signed)
°   08/23/21 0800  Psych Admission Type (Psych Patients Only)  Admission Status Involuntary  Psychosocial Assessment  Patient Complaints Anxiety;Depression  Eye Contact Fair  Facial Expression Anxious  Affect Appropriate to circumstance  Speech Logical/coherent  Interaction Minimal  Motor Activity Slow  Appearance/Hygiene Unremarkable  Behavior Characteristics Appropriate to situation;Anxious  Mood Anxious;Depressed  Thought Process  Coherency WDL  Content WDL  Delusions None reported or observed  Perception WDL  Hallucination None reported or observed  Judgment WDL  Confusion None  Danger to Self  Current suicidal ideation? Denies  Danger to Others  Danger to Others None reported or observed

## 2021-08-23 NOTE — Progress Notes (Signed)
° ° °   08/23/21 0605  Vital Signs  Temp 99.2 F (37.3 C)  Temp Source Oral  Pulse Rate (!) 59  Pulse Rate Source Monitor  BP 110/77  BP Location Left Arm  BP Method Automatic  Patient Position (if appropriate) Sitting  Oxygen Therapy  SpO2 99 %    Pt temperature elevated  at 99.2 F this morning.  Will request a recheck by day RN.

## 2021-08-23 NOTE — Group Note (Signed)
Type of Therapy and Topic:  Group Therapy:  Stress Management   Participation Level:  Active    Description of Group:  Patients in this group were introduced to the idea of stress and encouraged to discuss negative and positive ways to manage stress. Patients discussed specific stressors that they have in their life right now and the physical signs and symptoms associated with that stress.  Patient encouraged to come up with positive changes to assist with the stress upon discharge in order to prevent future hospitalizations.   They also worked as a group on developing a specific plan for several patients to deal with stressors through [place], psychoeducation and self care techniques   Therapeutic Goals:               1)  To discuss the positive and negative impacts of stress             2)  identify signs and symptoms of stress             3)  generate ideas for stress management             4)  offer mutual support to others regarding stress management             5)  Developing plans for ways to manage specific stressors upon discharge               Summary of Patient Progress:  Patient participated well in group.  She reports that during the holidays she often stresses about decorating.  Patient reports that taking one task at a time often assists with managing medications.    Therapeutic Modalities:   Motivational Interviewing Brief Solution-Focused Therapy    Kalven Ganim, LCSW, [place] Social Worker  [facility]

## 2021-08-23 NOTE — Progress Notes (Signed)
°  On assessment, pt presents with moderate anxiety.  Pt reports there will be a family meeting with husband, daughter, and sister."  Pt states, "I am a bit apprehensive.  Pt denies SI/HI and verbally contracts for safety.  Pt denies AVH.  Medication administered.    Pt is safe on unit with Q 15  minute safety checks.   08/22/21 2108  Precautions / Armbands  Precautions Other (Comment)  Patient armbands applied: Patient Identification (White)  B[facility] Fall Risk Assessment  Age 70  Mental Status 0  Physical Satus 1  Elimination 0  Sensory Impairments 0  Gait or Balance 2  History or falls in past 6 months 2  Mood Stabilizer Medications 0  Benzodiazepines 1  Narcotics 0  Sedatives/Hypnotics 2  Atypical Anti Psychotics 2  Detox Protocol (Alcohol, Narcotics, etc.) 0  Total Score 11  Patient Fall Risk Level High fall risk  Required Bundle Interventions *See Row Information* High fall risk - low and high requirements implemented  Screening for Fall Injury Risk (To be completed on HIGH fall risk patients) - Assessing Need for Floor Mats  Risk For Fall Injury- Criteria for Floor Mats None identified - No additional interventions needed  Safe Patient Handling Required Documentation-Repositioning Needs  Assist with movement in bed No  Fragile skin with pressure ulcer No  Unresponsive No  Safe Patient Handling Assessment  Ambulates independently Yes, no lift needed  Safety Interventions  Less Restrictive Interventions Active listening;Provide reassurance;Medicated;Observation  Safe Environment  Patient oriented to unit and equipment Yes

## 2021-08-23 NOTE — Progress Notes (Signed)
°   08/23/21 2114  Psych Admission Type (Psych Patients Only)  Admission Status Involuntary  Psychosocial Assessment  Patient Complaints Anxiety;Depression  Eye Contact Brief  Facial Expression Anxious  Affect Apprehensive  Speech Logical/coherent  Interaction Minimal;Guarded  Motor Activity Slow  Appearance/Hygiene Unremarkable  Behavior Characteristics Anxious  Mood Anxious;Depressed  Thought Process  Coherency WDL  Content WDL  Delusions None reported or observed  Perception WDL  Hallucination None reported or observed  Judgment WDL  Confusion None  Danger to Self  Current suicidal ideation? Denies

## 2021-08-23 NOTE — Progress Notes (Addendum)
[facility] MD Progress Note  08/23/2021 3:47 PM Jenna [name]  MRN:  [id number] Subjective:  Jenna [name] is a 70 yo patient w/ PPH of MDD who presented under IVC after Suicide Attempt via Overdose.    Case was discussed in the multidisciplinary team. MAR was reviewed and patient was compliant with medications.  She did not require any PRN's for agitation.     Psychiatric Team made the following recommendations yesterday:  Continue IVC -Continue Lexapro 10mg  (monitoring BMP given past h/o hyponatremia on past SSRI trials) -Continue Remeron 7.5mg  QHS -Continue Zyprexa Zydis 5 mg QHS -Continue room lock out during the day -Continue Melatonin 3 mg po PRN QHS -Continue Vistaril 10mg  tid PRN anxiety -Continue to discuss ECT/TMS for consideration - Social work to reach out for [facility] referral - Wednesday at 1: 28, family meeting   On assessment this AM patient is noted to be up and in a new pair of clothes. Patient reports that she is a bit anxious about her family meeting today. Patient and provider discuss worse and best case scenarios. Patient reported that in the worst case, she would not be able to go home and may have to live with a sister. Patient reported that although she did not want this, she would have to accept this. Patient reported that in the best case she would be able to go home. Patient reports that she is struggling abit today with her attention span and reading, but she slept well and is eating. Patient reports that her ruminative thoughts regarding guilt are about the same as yesterday. Patient denies SI, HI, and AVH. Patient reports she is looking forward to eating and getting out a bit more. She denies paranoia, ideas of reference or delusions.   Family Meeting: During Family meeting patient was notably anxious with stammering and a bit of shaking, but was able to speak for herself when given the opportunity and was taking notes about her discharge plan. SW, Lenna Sciara was  involved with the meeting and clarified that patient will have PHP intake in-person 12-27 at [facility] and will have ECT evaluations in 09/2021. Patient endorsed being a bit hesitant to attend [facility] "it's like having a job" but endorsed she would put in the effort. Patient's family agreed that this was a good plan. Patient's husband endorsed that patient can be discharged to the home and patient's daughter will also be staying with patient a few days. Patient was able to tell her family about her medications and how she feels in comparison to her presentation. Family endorsed that they feel patient is closer to her baseline.   Principal Problem: MDD (major depressive disorder), recurrent, severe, with psychosis ([place]) Diagnosis: Principal Problem:   MDD (major depressive disorder), recurrent, severe, with psychosis ([place]) Active Problems:   Anxiety   Malnourished ([place])   Vitamin D deficiency  Total Time Spent in Direct Patient Care:  I personally spent 45 minutes on the unit in direct patient care. The direct patient care time included face-to-face time with the patient, reviewing the patient's chart, communicating with other professionals, and coordinating care. Greater than 50% of this time was spent in counseling or coordinating care with the patient regarding goals of hospitalization, psycho-education, and discharge planning needs.   Past Psychiatric History: See H&P  Past Medical History:  Past Medical History:  Diagnosis Date   Anxiety    Hypertension    Hyponatremia    History reviewed. No pertinent surgical history. Family History:  Family History  Problem Relation Age of Onset   Hypertension Mother    Family Psychiatric  History: See H&P Social History:  Social History   Substance and Sexual Activity  Alcohol Use Not Currently   Alcohol/week: 0.0 standard drinks     Social History   Substance and Sexual Activity  Drug Use Not Currently    Social History   Socioeconomic  History   Marital status: Married    Spouse name: Not on file   Number of children: Not on file   Years of education: Not on file   Highest education level: Not on file  Occupational History   Not on file  Tobacco Use   Smoking status: Never   Smokeless tobacco: Never  Substance and Sexual Activity   Alcohol use: Not Currently    Alcohol/week: 0.0 standard drinks   Drug use: Not Currently   Sexual activity: Yes  Other Topics Concern   Not on file  Social History Narrative   Not on file   Social Determinants of Health   Financial Resource Strain: Not on file  Food Insecurity: Not on file  Transportation Needs: Not on file  Physical Activity: Not on file  Stress: Not on file  Social Connections: Not on file   Additional Social History:                         Sleep: Fair  Appetite:  Fair  Current Medications: Current Facility-Administered Medications  Medication Dose Route Frequency Provider Last Rate Last Admin   acetaminophen (TYLENOL) tablet 650 mg  650 mg Oral Q6H PRN Starkes-Perry, Gayland Curry, FNP       alum & mag hydroxide-simeth (MAALOX/MYLANTA) 200-200-20 MG/5ML suspension 30 mL  30 mL Oral Q4H PRN Starkes-Perry, Gayland Curry, FNP       amLODipine (NORVASC) tablet 10 mg  10 mg Oral Daily Suella Broad, FNP   10 mg at 08/23/21 0800   atenolol (TENORMIN) tablet 50 mg  50 mg Oral BID Suella Broad, FNP   50 mg at 08/23/21 0800   atorvastatin (LIPITOR) tablet 20 mg  20 mg Oral Daily Suella Broad, FNP   20 mg at 08/23/21 0800   calcium-vitamin D (OSCAL WITH D) 500-5 MG-MCG per tablet 1 tablet  1 tablet Oral Daily Suella Broad, FNP   1 tablet at 08/23/21 0759   escitalopram (LEXAPRO) tablet 10 mg  10 mg Oral Daily Damita Dunnings B, MD   10 mg at 08/23/21 0759   feeding supplement (BOOST / RESOURCE BREEZE) liquid 1 Container  1 Container Oral Q24H Massengill, Ovid Curd, MD   1 Container at 08/22/21 1540   hydrOXYzine (ATARAX) tablet  10 mg  10 mg Oral TID PRN Damita Dunnings B, MD   10 mg at 08/21/21 0754   lisinopril (ZESTRIL) tablet 20 mg  20 mg Oral Daily Suella Broad, FNP   20 mg at 08/23/21 0800   magnesium hydroxide (MILK OF MAGNESIA) suspension 30 mL  30 mL Oral Daily PRN Starkes-Perry, Gayland Curry, FNP       melatonin tablet 3 mg  3 mg Oral QHS PRN Nelda Marseille, Anjeanette Petzold E, MD       mirtazapine (REMERON) tablet 7.5 mg  7.5 mg Oral QHS Hill, Jackie Plum, MD   7.5 mg at 08/22/21 2108   multivitamin with minerals tablet 1 tablet  1 tablet Oral Daily Harlow Asa, MD   1 tablet at 08/23/21 0800  OLANZapine (ZYPREXA) tablet 5 mg  5 mg Oral QHS Damita Dunnings B, MD   5 mg at 08/22/21 2108   Vitamin D (Ergocalciferol) (DRISDOL) capsule 50,000 Units  50,000 Units Oral Q7 days Maida Sale, MD   50,000 Units at 08/16/21 2053    Lab Results: No results found for this or any previous visit (from the past 48 hour(s)).  Blood Alcohol level:  Lab Results  Component Value Date   ETH <10 56/43/3295    Metabolic Disorder Labs: Lab Results  Component Value Date   HGBA1C 5.4 08/10/2021   MPG 108.28 08/10/2021   No results found for: PROLACTIN Lab Results  Component Value Date   CHOL 141 08/10/2021   TRIG 82 08/10/2021   HDL 59 08/10/2021   CHOLHDL 2.4 08/10/2021   VLDL 16 08/10/2021   LDLCALC 66 08/10/2021    Physical Findings: AIMS: Facial and Oral Movements Muscles of Facial Expression: None, normal Lips and Perioral Area: None, normal Jaw: None, normal Tongue: None, normal,Extremity Movements Upper (arms, wrists, hands, fingers): None, normal Lower (legs, knees, ankles, toes): None, normal, Trunk Movements Neck, shoulders, hips: None, normal, Overall Severity Severity of abnormal movements (highest score from questions above): None, normal Incapacitation due to abnormal movements: None, normal Patient's awareness of abnormal movements (rate only patient's report): No Awareness, Dental  Status Current problems with teeth and/or dentures?: No Does patient usually wear dentures?: No   Musculoskeletal: Strength & Muscle Tone: within normal limits Gait & Station: normal Patient leans: N/A  Psychiatric Specialty Exam:  Presentation  General Appearance: casually dressed, improved hygiene  Eye Contact:Good  Speech:spontaneous, Clear and Coherent  Speech Volume:Decreased   Mood and Affect  Mood:Anxious  Affect:congruent   Thought Process  Thought Processes:Goal Directed  Descriptions of Associations:intact  Orientation:Full (Time, Place and Person)  Thought Content:Logical - denies AVH, paranoia, delusions, ideas of reference, SI or HI  History of Schizophrenia/Schizoaffective disorder:No  Hallucinations:Hallucinations: None  Ideas of Reference:None  Suicidal Thoughts:Suicidal Thoughts: No  Homicidal Thoughts:Homicidal Thoughts: No   Sensorium  Memory: Improved  Judgment:Fair  Insight:Fair   Executive Functions  Concentration:Fair  Attention Span:Fair  [place] of Knowledge:Good  Language:Good   Psychomotor Activity  Psychomotor Activity:Psychomotor Activity: Tremor (slight tremor in hands endorses worse with anxiety)   Assets  Assets:Communication Skills; Resilience; Social Support   Sleep  Time unrecorded   Physical Exam Constitutional:      Appearance: Normal appearance.  HENT:     Head: Normocephalic and atraumatic.  Pulmonary:     Effort: Pulmonary effort is normal.  Neurological:     General: No focal deficit present.     Mental Status: She is alert and oriented to person, place, and time.   Review of Systems  Psychiatric/Behavioral:  Negative for hallucinations and suicidal ideas. The patient is nervous/anxious.   Blood pressure 110/70, pulse (!) 58, temperature 99.2 F (37.3 C), temperature source Oral, resp. rate 14, height 4\' 11"  (1.499 m), weight 38.6 kg, SpO2 99 %. Body mass index is 17.21  kg/m.   Treatment Plan Summary: Daily contact with patient to assess and evaluate symptoms and progress in treatment and Medication management  Vung Kush is a 70 yo patient w/ PPH of MDD who presented under IVC after Suicide Attempt via Overdose.  Patient appears to be improving. Patient continues to interact well on the unit. Patient is attending and participating in groups. Patient was able to advocate for herself during family meeting this, despite profuse anxiety.  Patient given assignment to help with cognitive therapy and motivation.   MDD, recurrent severe - with psychotic features  -Continue Lexapro 10mg   -Continue Remeron 7.5mg  QHS -Continue Zyprexa Zydis 5 mg QHS for questionable paranoia and ruminating thoughts (QTC 476ms, A1c 5.4, and lipids WNL) - Repeat EKG 08/23/21: QTC 454ms, NSR -Continue Melatonin 3 mg po PRN QHS -Continue Vistaril 10mg  tid PRN anxiety  HTN: -Continue Amlodipine 10 mg daily -Continue Atenolol 50 mg BID -Continue Lisinopril 20 mg daily     HLD: -Continue Lipitor 20 mg daily     Nutrition: -Continue Ergocalciferol 50 000 units PO weekly    PGY-2 Freida Busman, MD 08/23/2021, 3:47 PM

## 2021-08-23 NOTE — BHH Counselor (Signed)
CSW participated in family meeting with psychiatrist, Damita Dunnings, patient, patient sister, patient daughter and patient husband.  At meeting care team discussed follow up appointments, next steps, discharge and progress of patient while hospitalized.  Family demonstrated understanding and everyone agreed to treatment plan.  Patient recommended for PHP and ECT treatment. Discharge is set for tomorrow.  Patient will return home with husband.  Patient daughter will be staying with patient and husband for a little bit after discharge.     Pankaj Haack, LCSW, [place] Social Worker  [facility]

## 2021-08-23 NOTE — BHH Suicide Risk Assessment (Signed)
[facility] Discharge Suicide Risk Assessment   Principal Problem: MDD (major depressive disorder), recurrent, severe, with psychosis ([place]) Discharge Diagnoses: Principal Problem:   MDD (major depressive disorder), recurrent, severe, with psychosis ([place]) Active Problems:   Anxiety   Vitamin D deficiency  Total Time Spent in Direct Patient Care:  I personally spent 35 minutes on the unit in direct patient care. The direct patient care time included face-to-face time with the patient, reviewing the patient's chart, communicating with other professionals, and coordinating care. Greater than 50% of this time was spent in counseling or coordinating care with the patient regarding goals of hospitalization, psycho-education, and discharge planning needs.  Subjective: Patient was seen on rounds. She denies SI, HI, AVH, paranoia, or delusions. She denies ideas of reference or first rank symptoms. She reports improved sleep and fair appetite and improved mood. She voices no physical complaints. She can articulate a safety and discharge plan. She was advised she will need ongoing monitoring of her lipids, glucose, CBC, EKG, weight and AIMS while on an atypical antipsychotic. She was advised to monitor her hand tremor and if it persists or worsens to talk to her outpatient provider about treatment options. We discussed potential dose reduction in Zyprexa, use of Amantadine, use of Cogentin, use of Neurontin, or change in her beta blocker to help with residual tremor and she declines these options presently stating the tremor is not presently impairing and she does not wish to change or start new medications. She thinks the tremor was present prior to admission and she feels it is partly related to anxiety. She was advised that her PCP will need to monitor her low Vit D. Time was given for questions.  Musculoskeletal: Strength & Muscle Tone: within normal limits Gait & Station: normal Patient leans: N/A  Psychiatric  Specialty Exam  Presentation  General Appearance: casually dressed, adequate hygiene  Eye Contact:Good  Speech:Clear and Coherent  Speech Volume:Decreased  Mood and Affect  Mood:Anxious  Affect:congruent   Thought Process  Thought Processes:Linear, goal directed  Descriptions of Associations:intact  Orientation:Full (Time, Place and Person)  Thought Content:Denies AVH, paranoia, ideas of reference delusions, or first rank symptoms; no acute psychosis noted on exam  Hallucinations:Hallucinations: None  Ideas of Reference:None  Suicidal Thoughts:Suicidal Thoughts: No  Homicidal Thoughts:Homicidal Thoughts: No   Sensorium  Memory:Recent - good  Judgment:Fair  Insight:Fair   Executive Functions  Concentration:Good  Attention Span:Good  [place] of Knowledge:Good  Language:Good   Psychomotor Activity  Psychomotor Activity:No cogwheeling, no stiffness, mild bilateral resting, fine hand tremors- AIMS 0   Assets  Assets:Communication Skills; Resilience; Social Support  Physical Exam Vitals reviewed.  Constitutional:      Appearance: Normal appearance.  HENT:     Head: Normocephalic.  Pulmonary:     Effort: Pulmonary effort is normal.  Neurological:     Mental Status: She is alert.   Review of Systems  Respiratory:  Negative for shortness of breath.   Cardiovascular:  Negative for chest pain.  Gastrointestinal:  Negative for diarrhea, nausea and vomiting.  Blood pressure 107/72, pulse 64, temperature 98.3 F (36.8 C), temperature source Oral, resp. rate 14, height 4\' 11"  (1.499 m), weight 38.6 kg, SpO2 100 %. Body mass index is 17.21 kg/m.  Mental Status Per Nursing Assessment::   On Admission:  suicide attempt prior to admission - resolved  Demographic Factors:  Age 70 or older and Caucasian, retired  Loss Factors: Decline in physical health and retired, intermittent marriage conflict, financial  stressors  Historical  Factors: Impulsivity and history of trauma  Risk Reduction Factors:   Sense of responsibility to family, Living with another person, especially a relative, Positive social support, and Positive coping skills or problem solving skills  Continued Clinical Symptoms:  Depression:   Impulsivity  Cognitive Features That Contribute To Risk:  None    Suicide Risk:  Mild:  There are no identifiable plans, no associated intent, good self-control on the unit, some other risk factors, and identifiable protective factors, including available and accessible social support.   Follow-up [place], [facility]. Go on 09/14/2021.   Specialty: Behavioral Health Why: You have an appointment for medication management services on on 09/14/21 at at 10:40 am.  This appointment will be held in person. Contact information: [address]  [place] [address] [place] Follow up on 09/13/2021.   Specialty: Behavioral Health Why: You have an appointment for therapy services on 09/13/21 at 9:00 am with Cordella Register.  This appointment will be Virtual via your MyChart account, but following appointments may be held in person. Contact information: [place]  [address] [place], Counselor Follow up.   Why: This provider is accepting new patients and will accept your insurance.  If you are interested in scheduling an appointment for therapy services, please call. Contact information: [address] [place], [place] [address]  P: [phone]        [place] In Person PHP program. Go to.   Why: You have an intake appointment for partial hospitalization program (PHP)  at this location on Tuesday, August 29, 2021 at 9am.  Please arrive with ID and insurance card.  The appointment will last for approximately 1  hour and is in person. Contact information: (ph) [phone] ([phone] [address] [place], [place] [address]        [place]. Call.   Why: A referral has been made for ECT services on your behalf. We have set up an ECT consult appointment for Tuesday, January 17th at 1pm.  The initial appointment will be over the phone. Contact information: [phone] [address]  [place],  [place]  [address]                Plan Of Care/Follow-up recommendations:  Activity:  as tolerated Diet:  heart healthy Other:  Patient advised to keep scheduled outpatient mental health appointments and to comply with medications. She was encouraged to see her primary care provider without fail for recheck/management of her Vit D deficiency and for recheck of her hand tremors without fail. She was reminded she will need ongoing monitoring of her weight, EKG, AIMS, lipids, CBC, and glucose while on Zyprexa.   Harlow Asa, MD, FAPA 08/24/2021, 6:41 AM

## 2021-08-24 MED ORDER — OLANZAPINE 5 MG PO TABS: 5.0000 mg | ORAL_TABLET | Freq: Every day | ORAL | 0 refills | Status: AC

## 2021-08-24 MED ORDER — ESCITALOPRAM OXALATE 10 MG PO TABS: 10.0000 mg | ORAL_TABLET | Freq: Every day | ORAL | 0 refills | Status: AC

## 2021-08-24 MED ORDER — HYDROXYZINE HCL 10 MG PO TABS: 10.0000 mg | ORAL_TABLET | Freq: Three times a day (TID) | ORAL | 0 refills | Status: AC | PRN

## 2021-08-24 MED ORDER — MIRTAZAPINE 7.5 MG PO TABS
7.5000 mg | ORAL_TABLET | Freq: Every day | ORAL | 0 refills | Status: AC
Start: 2021-08-24 — End: ?

## 2021-08-24 MED ORDER — ADULT MULTIVITAMIN W/MINERALS CH: 1.0000 | ORAL_TABLET | Freq: Every day | ORAL | 0 refills | Status: AC

## 2021-08-24 MED ORDER — OYSTER SHELL CALCIUM/D3 500-5 MG-MCG PO TABS: 1.0000 | ORAL_TABLET | Freq: Every day | ORAL | 0 refills | Status: AC

## 2021-08-24 MED ORDER — MELATONIN 3 MG PO TABS
3.0000 mg | ORAL_TABLET | Freq: Every evening | ORAL | 0 refills | Status: AC | PRN
Start: 2021-08-24 — End: ?

## 2021-08-24 NOTE — Progress Notes (Signed)
°  [facility] Adult Case Management Discharge Plan :  Will you be returning to the same living situation after discharge:  Yes,  living with husband in house in [place].  At discharge, do you have transportation home?: Yes,  Husband and daughter will pick patient up Do you have the ability to pay for your medications: Yes,  insurance  Release of information consent forms completed and in the chart;  Patient's signature needed at discharge.  Patient to Follow up at:  Follow-up [place], [facility]. Go on 09/14/2021.   Specialty: Behavioral Health Why: You have an appointment for medication management services on on 09/14/21 at at 10:40 am.  This appointment will be held in person. Contact information: [address] [place] [address] [place] Follow up on 09/13/2021.   Specialty: Behavioral Health Why: You have an appointment for therapy services on 09/13/21 at 9:00 am with Cordella Register.  This appointment will be Virtual via your MyChart account, but following appointments may be held in person. Contact information: [place]  [address] [place], Counselor Follow up.   Why: This provider is accepting new patients and will accept your insurance.  If you are interested in scheduling an appointment for therapy services, please call. Contact information: [address] [place], [place] [address]  P: [phone]        [place] In Person PHP program. Go to.   Why: You have an intake appointment for partial hospitalization program (PHP)  at this location on Tuesday, August 29, 2021 at 9am.  Please arrive with ID and insurance card.  The appointment will last for approximately 1 hour and is in person. Contact information: [phone]) [phone] ([phone] [address]  [place], [place] [address]        [place]. Call.   Why: A referral has been made for ECT services on your behalf. We have set up an ECT consult appointment for Tuesday, January 17th at 1pm.  The initial appointment will be over the phone. Contact information: [place]  [place],  [place]  [address]                Next level of care provider has access to [place] and Suicide Prevention discussed: Yes,  Husband, Carlea Badour     Has patient been referred to the Quitline?: N/A patient is not a smoker  Patient has been referred for addiction treatment: [place], LCSW 08/24/2021, 10:41 AM

## 2021-08-24 NOTE — Discharge Summary (Signed)
Physician Discharge Summary Note  Patient:  Jenna [name] is an 70 y.o., female MRN:  [id number] DOB:  [birth year] Patient phone:  [phone] (home)  Patient address:   [place] [address],  Total Time spent with patient: 15 minutes  Date of Admission:  08/11/2021 Date of Discharge: 08/24/2021  Reason for Admission:  Jenna [name] is a 70 yo patient w/ PPH of MDD who presented to [facility] under IVC and was transferred to [facility] after SI.  Principal Problem: MDD (major depressive disorder), recurrent, severe, with psychosis ([place]) Discharge Diagnoses: Principal Problem:   MDD (major depressive disorder), recurrent, severe, with psychosis ([place]) Active Problems:   Anxiety   Vitamin D deficiency   Past Psychiatric History: Patient was dx with MDD and was seeing Marguarite Arbour at [facility] until 3 mon ago. Pateitn also had OP therapy in the past. Patient previous medications include Celexa (worked well, not sure why discontinued), Busapr (not helping), Prozac (hyperhydrosis, poor compliance due to this), Pristiq (failed after 67mon- 51yr). No Prior Inpatient psych hosp.  Past Medical History:  Past Medical History:  Diagnosis Date   Anxiety    Hypertension    Hyponatremia    History reviewed. No pertinent surgical history. Family History:  Family History  Problem Relation Age of Onset   Hypertension Mother    Family Psychiatric  History: Mom: Depression and Anxiety. Pat. Grandfather had ECT. (When ECT was first created), Sister is on Lexapro.  Social History:  Social History   Substance and Sexual Activity  Alcohol Use Not Currently   Alcohol/week: 0.0 standard drinks     Social History   Substance and Sexual Activity  Drug Use Not Currently    Social History   Socioeconomic History   Marital status: Married    Spouse name: Not on file   Number of children: Not on file   Years of education: Not on file   Highest education level: Not on file   Occupational History   Not on file  Tobacco Use   Smoking status: Never   Smokeless tobacco: Never  Substance and Sexual Activity   Alcohol use: Not Currently    Alcohol/week: 0.0 standard drinks   Drug use: Not Currently   Sexual activity: Yes  Other Topics Concern   Not on file  Social History Narrative   Not on file   Social Determinants of Health   Financial Resource Strain: Not on file  Food Insecurity: Not on file  Transportation Needs: Not on file  Physical Activity: Not on file  Stress: Not on file  Social Connections: Not on file    Hospital Course:  Jenna [name] is a 70 yo patient w/ PPH of MDD who presented under IVC after Suicide Attempt via Overdose. When patient was admitted she was disheveled, unstable ambulating, isolative and very anxious. Patient had a very poor appetite and resistant to medication and treatment. Patient was started on Celexa however this was changed after one day due to concern for anticholinergic side effects. Patient was slowly titrated up. Patient was encouraged to integrate into the mileu and to increase nutrition intake. Patient continued to be isolative and was not sleeping well. Patient was placed on room lockout. Patient slowly began to attend at least one group but refused to participate. Eventually patient began to participate in groups and endorsed that she was enjoying it despite initial resistance. Patient began attending more groups and Remeron was started and she began to sleep a bit  better. Patient was noted to still be a bit isolative and anxious. Patient was started on Zyprexa. When patient was educated about Zyprexa she endorsed that she thought she was psychotic and had thought this for a few months, but had been afraid to tell her OP psychiatrist. Patient endorsed she was having ruminating thoughts about her poor behavior and decline over the past 2 years. Patient responded very well to Zyprexa and endorsed increased concentration,  sleep,decreased ruminating thoughts, and decreased anxiety. Patient was noted to attend more groups, spend more time on the unit, and made friends with patients on the unit. Patient began to eat with other people in the cafeteria and endorsed she no longer felt unsafe. Patient was also noted to ambulate much better on the unit.   A family meeting was held to assist with future planning for patient. Throughout patient's hospitalization patient 's daughter and sister were very supportive. Patient's husband was very nervous about patient returning home and initially was refusing. Patient was noted to slowly improve with medication, but was recommended for ECT eval outpatient. Due to the holiday season inpatient ECT was not possible. SW worked to get patient into PHP in [place] and referrals for ECT in both [place] and [place]. Patient's family endorsed they were pleased with patient's progress and she would return home and her daughter would help patient's husband with patient follow up.   At discharge patient endorsed that she had benefited from the hospitalization and felt she could remain compliant on medication and therapy. Patient denied SI, HI, and AVH.  Physical Findings: AIMS: Facial and Oral Movements Muscles of Facial Expression: None, normal Lips and Perioral Area: None, normal Jaw: None, normal Tongue: None, normal,Extremity Movements Upper (arms, wrists, hands, fingers): None, normal Lower (legs, knees, ankles, toes): None, normal, Trunk Movements Neck, shoulders, hips: None, normal, Overall Severity Severity of abnormal movements (highest score from questions above): None, normal Incapacitation due to abnormal movements: None, normal Patient's awareness of abnormal movements (rate only patient's report): No Awareness, Dental Status Current problems with teeth and/or dentures?: No Does patient usually wear dentures?: No  CIWA:  CIWA-Ar Total: 1 COWS:  COWS Total Score:  2  Musculoskeletal: Strength & Muscle Tone: normal Gait & Station: within normal limits Patient leans: N/A   Psychiatric Specialty Exam:  Presentation  General Appearance: Appropriate for Environment  Eye Contact:Good  Speech:Clear and Coherent  Speech Volume:Normal  Handedness:No data recorded  Mood and Affect  Mood:Euthymic  Affect:Appropriate   Thought Process  Thought Processes:Coherent  Descriptions of Associations:Intact  Orientation:Full (Time, Place and Person)  Thought Content:Logical  History of Schizophrenia/Schizoaffective disorder:No  Duration of Psychotic Symptoms:No data recorded Hallucinations:Hallucinations: None  Ideas of Reference:None  Suicidal Thoughts:Suicidal Thoughts: No  Homicidal Thoughts:Homicidal Thoughts: No   Sensorium  Memory:Immediate Good; Recent Good; Remote Good  Judgment:Good  Insight:Good   Executive Functions  Concentration:Good  Attention Span:Good  [place] of Knowledge:Good  Language:Good   Psychomotor Activity  Psychomotor Activity:Psychomotor Activity: Normal   Assets  Assets:Communication Skills; Resilience; Housing; Social Support   Sleep  Sleep:Sleep: Fair    Physical Exam: Physical Exam HENT:     Head: Normocephalic and atraumatic.  Pulmonary:     Effort: Pulmonary effort is normal.  Neurological:     Mental Status: She is alert and oriented to person, place, and time.   Review of Systems  Psychiatric/Behavioral:  Negative for suicidal ideas.   Blood pressure 107/72, pulse 64, temperature 98.3 F (36.8 C), temperature  source Oral, resp. rate 14, height 4\' 11"  (1.499 m), weight 38.6 kg, SpO2 100 %. Body mass index is 17.21 kg/m.   Social History   Tobacco Use  Smoking Status Never  Smokeless Tobacco Never   Tobacco Cessation:  N/A, patient does not currently use tobacco products   Blood Alcohol level:  Lab Results  Component Value Date   ETH <10  74/04/1447    Metabolic Disorder Labs:  Lab Results  Component Value Date   HGBA1C 5.4 08/10/2021   MPG 108.28 08/10/2021   No results found for: PROLACTIN Lab Results  Component Value Date   CHOL 141 08/10/2021   TRIG 82 08/10/2021   HDL 59 08/10/2021   CHOLHDL 2.4 08/10/2021   VLDL 16 08/10/2021   LDLCALC 66 08/10/2021    See Psychiatric Specialty Exam and Suicide Risk Assessment completed by Attending Physician prior to discharge.  Discharge destination:  Home  Is patient on multiple antipsychotic therapies at discharge:  No   Has Patient had three or more failed trials of antipsychotic monotherapy by history:  No  Recommended Plan for Multiple Antipsychotic Therapies: NA   Allergies as of 08/24/2021       Reactions   Amitriptyline Hcl Other (See Comments)   Made the patient feel "shaky and dizzy"   Fluoxetine Other (See Comments)   "Makes me sweat all over- I don't like it"   Trazodone Hcl Other (See Comments)   "Caused issues urinating"        Medication List     STOP taking these medications    acetaminophen 325 MG tablet Commonly known as: TYLENOL   busPIRone 15 MG tablet Commonly known as: BUSPAR   calcium-vitamin D 500-200 MG-UNIT tablet Commonly known as: OSCAL WITH D Replaced by: calcium-vitamin D 500-5 MG-MCG tablet   clonazePAM 0.5 MG tablet Commonly known as: KLONOPIN   FLUoxetine 40 MG capsule Commonly known as: PROZAC       TAKE these medications      Indication  amLODipine 10 MG tablet Commonly known as: NORVASC Take 1 tablet (10 mg total) by mouth daily. For BP  Indication: High Blood Pressure Disorder   atenolol 50 MG tablet Commonly known as: TENORMIN Take 1 tablet (50 mg total) by mouth 2 (two) times daily. For BP  Indication: High Blood Pressure Disorder   atorvastatin 20 MG tablet Commonly known as: LIPITOR Take 20 mg by mouth daily.  Indication: High Amount of Fats in the Blood   calcium-vitamin D 500-5  MG-MCG tablet Commonly known as: OSCAL WITH D Take 1 tablet by mouth daily. Start taking on: August 25, 2021 Replaces: calcium-vitamin D 500-200 MG-UNIT tablet  Indication: Low Amount of Calcium in the Blood   escitalopram 10 MG tablet Commonly known as: LEXAPRO Take 1 tablet (10 mg total) by mouth daily. Start taking on: August 25, 2021  Indication: Major Depressive Disorder   hydrOXYzine 10 MG tablet Commonly known as: ATARAX Take 1 tablet (10 mg total) by mouth 3 (three) times daily as needed for anxiety.  Indication: Feeling Anxious   lisinopril 20 MG tablet Commonly known as: ZESTRIL Take 1 tablet (20 mg total) by mouth daily. For BP What changed: when to take this  Indication: High Blood Pressure Disorder   melatonin 3 MG Tabs tablet Take 1 tablet (3 mg total) by mouth at bedtime as needed.  Indication: Trouble Sleeping   mirtazapine 7.5 MG tablet Commonly known as: REMERON Take 1 tablet (7.5 mg total) by  mouth at bedtime.  Indication: Major Depressive Disorder   multivitamin with minerals Tabs tablet Take 1 tablet by mouth daily. Start taking on: August 25, 2021  Indication: Vitamin Deficiency   OLANZapine 5 MG tablet Commonly known as: ZYPREXA Take 1 tablet (5 mg total) by mouth at bedtime.  Indication: Major Depressive Disorder   Prolia 60 MG/ML Sosy injection Generic drug: denosumab 60 mg every 6 (six) months.  Indication: Osteoporosis        Follow-up [place], [facility]. Go on 09/14/2021.   Specialty: Behavioral Health Why: You have an appointment for medication management services on on 09/14/21 at at 10:40 am.  This appointment will be held in person. Contact information: [address] [place] [address] [place] Follow up on 09/13/2021.   Specialty: Behavioral Health Why: You have an appointment for therapy services  on 09/13/21 at 9:00 am with Cordella Register.  This appointment will be Virtual via your MyChart account, but following appointments may be held in person. Contact information: [place]  [address] [place], Counselor Follow up.   Why: This provider is accepting new patients and will accept your insurance.  If you are interested in scheduling an appointment for therapy services, please call. Contact information: [address] [place], [place] [address]  P: [phone]        [place] In Person PHP program. Go to.   Why: You have an intake appointment for partial hospitalization program (PHP)  at this location on Tuesday, August 29, 2021 at 9am.  Please arrive with ID and insurance card.  The appointment will last for approximately 1 hour and is in person. Contact information: (ph) [phone] ([phone] [address] [place], [place] [address]        [place]. Call.   Why: A referral has been made for ECT services on your behalf. We have set up an ECT consult appointment for Tuesday, January 17th at 1pm.  The initial appointment will be over the phone. Contact information: [phone] [address]  [place],  [place]  [address]                Follow-up recommendations:  Follow up recommendations: - Activity as tolerated. - Diet as recommended by PCP. - Keep all scheduled follow-up appointments as recommended.   Comments:  Patient is instructed to take all prescribed medications as recommended. Report any side effects or adverse reactions to your outpatient psychiatrist. Patient is instructed to abstain from alcohol and illegal drugs while on prescription medications. In the event of worsening symptoms, patient is instructed to call the crisis hotline, 911, or go to the nearest emergency department for evaluation and  treatment.    Signed:  PGY-2 Freida Busman, MD 08/24/2021, 4:33 PM

## 2021-08-24 NOTE — Discharge Instructions (Addendum)
Dear Jenna [name],   Thank you so much for allowing [place] to be part of your care!  You were admitted to [facility] for Major Depression.   POST-HOSPITAL & CARE INSTRUCTIONS Please continue to take your Lexapro 10mg  daily. Your Remeron 7.5mg  and Zyprexa 5mg  nightly.  Please let PCP/Specialists know of any changes that were made.  Please see medications section of this packet for any medication changes.   DOCTOR'S APPOINTMENT & FOLLOW UP CARE INSTRUCTIONS  Future Appointments  Date Time Provider [place]  09/13/2021  9:00 AM Lequita Asal, LCSW [facility] None  09/19/2021  1:00 PM Clapacs, Madie [place], MD [facility] None   The above appts are those within the [place]. Please look at the "Follow- up Provider's" section of this packet for all of your Follow-up appts.  RETURN PRECAUTIONS:   Take care and be well!  [facility] [address]  [place], [place] [address]

## 2021-08-24 NOTE — BHH Group Notes (Signed)
Pt attended morning goals group.

## 2021-08-24 NOTE — Care Management Important Message (Signed)
Medicare IM printed and given to [facility], LCSW to give to the patient

## 2021-08-24 NOTE — Progress Notes (Signed)
Patient discharged to home/self care in the presence of her significant other.  Patient denies SI, HI and AVH at discharge.  Patietn expressed her desire to go home and spend the holiday with her family.  Patient was in a bright mood.  Patient acknowledged understanding of discharge instructions and receipt of personal belongings.

## 2021-08-24 NOTE — Plan of Care (Signed)
Patient has met criteria of care plan adequately for discharge.  °

## 2021-08-29 DIAGNOSIS — F332 Major depressive disorder, recurrent severe without psychotic features: Secondary | ICD-10-CM | POA: Diagnosis not present

## 2021-08-29 DIAGNOSIS — R69 Illness, unspecified: Secondary | ICD-10-CM | POA: Diagnosis not present

## 2021-08-31 DIAGNOSIS — F332 Major depressive disorder, recurrent severe without psychotic features: Secondary | ICD-10-CM | POA: Diagnosis not present

## 2021-08-31 DIAGNOSIS — Z7189 Other specified counseling: Secondary | ICD-10-CM | POA: Diagnosis not present

## 2021-08-31 DIAGNOSIS — R69 Illness, unspecified: Secondary | ICD-10-CM | POA: Diagnosis not present

## 2021-09-01 DIAGNOSIS — Z7189 Other specified counseling: Secondary | ICD-10-CM | POA: Diagnosis not present

## 2021-09-01 DIAGNOSIS — F332 Major depressive disorder, recurrent severe without psychotic features: Secondary | ICD-10-CM | POA: Diagnosis not present

## 2021-09-01 DIAGNOSIS — R69 Illness, unspecified: Secondary | ICD-10-CM | POA: Diagnosis not present

## 2021-09-05 DIAGNOSIS — Z7189 Other specified counseling: Secondary | ICD-10-CM | POA: Diagnosis not present

## 2021-09-05 DIAGNOSIS — R69 Illness, unspecified: Secondary | ICD-10-CM | POA: Diagnosis not present

## 2021-09-05 DIAGNOSIS — F332 Major depressive disorder, recurrent severe without psychotic features: Secondary | ICD-10-CM | POA: Diagnosis not present

## 2021-09-06 DIAGNOSIS — C44519 Basal cell carcinoma of skin of other part of trunk: Secondary | ICD-10-CM | POA: Diagnosis not present

## 2021-09-06 DIAGNOSIS — L988 Other specified disorders of the skin and subcutaneous tissue: Secondary | ICD-10-CM | POA: Diagnosis not present

## 2021-09-07 DIAGNOSIS — F332 Major depressive disorder, recurrent severe without psychotic features: Secondary | ICD-10-CM | POA: Diagnosis not present

## 2021-09-07 DIAGNOSIS — R69 Illness, unspecified: Secondary | ICD-10-CM | POA: Diagnosis not present

## 2021-09-07 DIAGNOSIS — Z7189 Other specified counseling: Secondary | ICD-10-CM | POA: Diagnosis not present

## 2021-09-08 DIAGNOSIS — Z7189 Other specified counseling: Secondary | ICD-10-CM | POA: Diagnosis not present

## 2021-09-08 DIAGNOSIS — R69 Illness, unspecified: Secondary | ICD-10-CM | POA: Diagnosis not present

## 2021-09-08 DIAGNOSIS — F332 Major depressive disorder, recurrent severe without psychotic features: Secondary | ICD-10-CM | POA: Diagnosis not present

## 2021-09-11 DIAGNOSIS — F332 Major depressive disorder, recurrent severe without psychotic features: Secondary | ICD-10-CM | POA: Diagnosis not present

## 2021-09-11 DIAGNOSIS — R69 Illness, unspecified: Secondary | ICD-10-CM | POA: Diagnosis not present

## 2021-09-11 DIAGNOSIS — Z7189 Other specified counseling: Secondary | ICD-10-CM | POA: Diagnosis not present

## 2021-09-12 DIAGNOSIS — F332 Major depressive disorder, recurrent severe without psychotic features: Secondary | ICD-10-CM | POA: Diagnosis not present

## 2021-09-12 DIAGNOSIS — Z7189 Other specified counseling: Secondary | ICD-10-CM | POA: Diagnosis not present

## 2021-09-12 DIAGNOSIS — R69 Illness, unspecified: Secondary | ICD-10-CM | POA: Diagnosis not present

## 2021-09-13 ENCOUNTER — Ambulatory Visit (INDEPENDENT_AMBULATORY_CARE_PROVIDER_SITE_OTHER): Payer: Self-pay | Admitting: Licensed Clinical Social Worker

## 2021-09-13 DIAGNOSIS — F489 Nonpsychotic mental disorder, unspecified: Secondary | ICD-10-CM

## 2021-09-13 NOTE — Progress Notes (Signed)
Therapist contacted patient through My Chart and she did not respond. Session is a no show 

## 2021-09-14 DIAGNOSIS — F331 Major depressive disorder, recurrent, moderate: Secondary | ICD-10-CM | POA: Diagnosis not present

## 2021-09-14 DIAGNOSIS — F419 Anxiety disorder, unspecified: Secondary | ICD-10-CM | POA: Diagnosis not present

## 2021-09-14 DIAGNOSIS — R69 Illness, unspecified: Secondary | ICD-10-CM | POA: Diagnosis not present

## 2021-09-15 DIAGNOSIS — Z7189 Other specified counseling: Secondary | ICD-10-CM | POA: Diagnosis not present

## 2021-09-15 DIAGNOSIS — F332 Major depressive disorder, recurrent severe without psychotic features: Secondary | ICD-10-CM | POA: Diagnosis not present

## 2021-09-15 DIAGNOSIS — R69 Illness, unspecified: Secondary | ICD-10-CM | POA: Diagnosis not present

## 2021-09-19 ENCOUNTER — Telehealth: Payer: Medicare HMO | Admitting: Psychiatry

## 2021-09-19 ENCOUNTER — Other Ambulatory Visit: Payer: Self-pay

## 2021-10-18 DIAGNOSIS — F331 Major depressive disorder, recurrent, moderate: Secondary | ICD-10-CM | POA: Diagnosis not present

## 2021-10-18 DIAGNOSIS — F419 Anxiety disorder, unspecified: Secondary | ICD-10-CM | POA: Diagnosis not present

## 2021-10-18 DIAGNOSIS — R69 Illness, unspecified: Secondary | ICD-10-CM | POA: Diagnosis not present

## 2021-11-15 DIAGNOSIS — R69 Illness, unspecified: Secondary | ICD-10-CM | POA: Diagnosis not present

## 2021-11-15 DIAGNOSIS — I10 Essential (primary) hypertension: Secondary | ICD-10-CM | POA: Diagnosis not present

## 2021-11-15 DIAGNOSIS — E7521 Fabry (-Anderson) disease: Secondary | ICD-10-CM | POA: Diagnosis not present

## 2021-11-15 DIAGNOSIS — M810 Age-related osteoporosis without current pathological fracture: Secondary | ICD-10-CM | POA: Diagnosis not present

## 2021-11-15 DIAGNOSIS — E7800 Pure hypercholesterolemia, unspecified: Secondary | ICD-10-CM | POA: Diagnosis not present

## 2022-01-16 DIAGNOSIS — F419 Anxiety disorder, unspecified: Secondary | ICD-10-CM | POA: Diagnosis not present

## 2022-01-16 DIAGNOSIS — R69 Illness, unspecified: Secondary | ICD-10-CM | POA: Diagnosis not present

## 2022-01-16 DIAGNOSIS — F331 Major depressive disorder, recurrent, moderate: Secondary | ICD-10-CM | POA: Diagnosis not present

## 2022-01-31 DIAGNOSIS — L719 Rosacea, unspecified: Secondary | ICD-10-CM | POA: Diagnosis not present

## 2022-01-31 DIAGNOSIS — L570 Actinic keratosis: Secondary | ICD-10-CM | POA: Diagnosis not present

## 2022-01-31 DIAGNOSIS — Z85828 Personal history of other malignant neoplasm of skin: Secondary | ICD-10-CM | POA: Diagnosis not present

## 2022-01-31 DIAGNOSIS — L853 Xerosis cutis: Secondary | ICD-10-CM | POA: Diagnosis not present

## 2022-01-31 DIAGNOSIS — D225 Melanocytic nevi of trunk: Secondary | ICD-10-CM | POA: Diagnosis not present

## 2022-01-31 DIAGNOSIS — L578 Other skin changes due to chronic exposure to nonionizing radiation: Secondary | ICD-10-CM | POA: Diagnosis not present

## 2022-01-31 DIAGNOSIS — Z808 Family history of malignant neoplasm of other organs or systems: Secondary | ICD-10-CM | POA: Diagnosis not present

## 2022-01-31 DIAGNOSIS — D2271 Melanocytic nevi of right lower limb, including hip: Secondary | ICD-10-CM | POA: Diagnosis not present

## 2022-01-31 DIAGNOSIS — L814 Other melanin hyperpigmentation: Secondary | ICD-10-CM | POA: Diagnosis not present

## 2022-01-31 DIAGNOSIS — L821 Other seborrheic keratosis: Secondary | ICD-10-CM | POA: Diagnosis not present

## 2022-02-05 ENCOUNTER — Ambulatory Visit (HOSPITAL_COMMUNITY): Payer: Medicare HMO | Admitting: Licensed Clinical Social Worker

## 2022-02-05 NOTE — Progress Notes (Signed)
Therapist contacted patient for an assessment by My Chart and she did not respond. Session is a no show

## 2022-02-15 DIAGNOSIS — R69 Illness, unspecified: Secondary | ICD-10-CM | POA: Diagnosis not present

## 2022-02-15 DIAGNOSIS — I10 Essential (primary) hypertension: Secondary | ICD-10-CM | POA: Diagnosis not present

## 2022-02-15 DIAGNOSIS — E7800 Pure hypercholesterolemia, unspecified: Secondary | ICD-10-CM | POA: Diagnosis not present

## 2022-02-15 DIAGNOSIS — F331 Major depressive disorder, recurrent, moderate: Secondary | ICD-10-CM | POA: Diagnosis not present

## 2022-03-05 DIAGNOSIS — Z1231 Encounter for screening mammogram for malignant neoplasm of breast: Secondary | ICD-10-CM | POA: Diagnosis not present

## 2022-03-07 DIAGNOSIS — R69 Illness, unspecified: Secondary | ICD-10-CM | POA: Diagnosis not present

## 2022-03-07 DIAGNOSIS — F331 Major depressive disorder, recurrent, moderate: Secondary | ICD-10-CM | POA: Diagnosis not present

## 2022-04-16 DIAGNOSIS — F419 Anxiety disorder, unspecified: Secondary | ICD-10-CM | POA: Diagnosis not present

## 2022-04-16 DIAGNOSIS — F331 Major depressive disorder, recurrent, moderate: Secondary | ICD-10-CM | POA: Diagnosis not present

## 2022-04-16 DIAGNOSIS — R69 Illness, unspecified: Secondary | ICD-10-CM | POA: Diagnosis not present

## 2022-08-01 DIAGNOSIS — E860 Dehydration: Secondary | ICD-10-CM | POA: Diagnosis not present

## 2022-08-01 DIAGNOSIS — R609 Edema, unspecified: Secondary | ICD-10-CM | POA: Diagnosis not present

## 2022-08-01 DIAGNOSIS — I10 Essential (primary) hypertension: Secondary | ICD-10-CM | POA: Diagnosis not present

## 2022-08-01 DIAGNOSIS — D72829 Elevated white blood cell count, unspecified: Secondary | ICD-10-CM | POA: Diagnosis not present

## 2022-08-01 DIAGNOSIS — W19XXXA Unspecified fall, initial encounter: Secondary | ICD-10-CM | POA: Diagnosis not present

## 2022-08-01 DIAGNOSIS — Z91148 Patient's other noncompliance with medication regimen for other reason: Secondary | ICD-10-CM | POA: Diagnosis not present

## 2022-08-01 DIAGNOSIS — R42 Dizziness and giddiness: Secondary | ICD-10-CM | POA: Diagnosis not present

## 2022-08-01 DIAGNOSIS — M25571 Pain in right ankle and joints of right foot: Secondary | ICD-10-CM | POA: Diagnosis not present

## 2022-08-01 DIAGNOSIS — S82841A Displaced bimalleolar fracture of right lower leg, initial encounter for closed fracture: Secondary | ICD-10-CM | POA: Diagnosis not present

## 2022-08-01 DIAGNOSIS — Z743 Need for continuous supervision: Secondary | ICD-10-CM | POA: Diagnosis not present

## 2022-08-01 DIAGNOSIS — T796XXA Traumatic ischemia of muscle, initial encounter: Secondary | ICD-10-CM | POA: Diagnosis not present

## 2022-08-01 DIAGNOSIS — S82431A Displaced oblique fracture of shaft of right fibula, initial encounter for closed fracture: Secondary | ICD-10-CM | POA: Diagnosis not present

## 2022-08-02 DIAGNOSIS — W19XXXA Unspecified fall, initial encounter: Secondary | ICD-10-CM | POA: Diagnosis not present

## 2022-08-02 DIAGNOSIS — Z91148 Patient's other noncompliance with medication regimen for other reason: Secondary | ICD-10-CM | POA: Diagnosis not present

## 2022-08-02 DIAGNOSIS — S82841A Displaced bimalleolar fracture of right lower leg, initial encounter for closed fracture: Secondary | ICD-10-CM | POA: Diagnosis not present

## 2022-08-02 DIAGNOSIS — Y92002 Bathroom of unspecified non-institutional (private) residence single-family (private) house as the place of occurrence of the external cause: Secondary | ICD-10-CM | POA: Diagnosis not present

## 2022-08-02 DIAGNOSIS — E860 Dehydration: Secondary | ICD-10-CM | POA: Diagnosis not present

## 2022-08-02 DIAGNOSIS — M25571 Pain in right ankle and joints of right foot: Secondary | ICD-10-CM | POA: Diagnosis not present

## 2022-08-02 DIAGNOSIS — Z79899 Other long term (current) drug therapy: Secondary | ICD-10-CM | POA: Diagnosis not present

## 2022-08-02 DIAGNOSIS — W010XXA Fall on same level from slipping, tripping and stumbling without subsequent striking against object, initial encounter: Secondary | ICD-10-CM | POA: Diagnosis not present

## 2022-08-02 DIAGNOSIS — D72829 Elevated white blood cell count, unspecified: Secondary | ICD-10-CM | POA: Diagnosis not present

## 2022-08-02 DIAGNOSIS — F332 Major depressive disorder, recurrent severe without psychotic features: Secondary | ICD-10-CM | POA: Diagnosis not present

## 2022-08-02 DIAGNOSIS — N179 Acute kidney failure, unspecified: Secondary | ICD-10-CM | POA: Diagnosis not present

## 2022-08-02 DIAGNOSIS — F411 Generalized anxiety disorder: Secondary | ICD-10-CM | POA: Diagnosis not present

## 2022-08-02 DIAGNOSIS — R296 Repeated falls: Secondary | ICD-10-CM | POA: Diagnosis not present

## 2022-08-02 DIAGNOSIS — R69 Illness, unspecified: Secondary | ICD-10-CM | POA: Diagnosis not present

## 2022-08-02 DIAGNOSIS — E785 Hyperlipidemia, unspecified: Secondary | ICD-10-CM | POA: Diagnosis not present

## 2022-08-02 DIAGNOSIS — E872 Acidosis, unspecified: Secondary | ICD-10-CM | POA: Diagnosis not present

## 2022-08-02 DIAGNOSIS — I10 Essential (primary) hypertension: Secondary | ICD-10-CM | POA: Diagnosis not present

## 2022-08-02 DIAGNOSIS — T796XXA Traumatic ischemia of muscle, initial encounter: Secondary | ICD-10-CM | POA: Diagnosis not present

## 2022-08-02 DIAGNOSIS — Z20822 Contact with and (suspected) exposure to covid-19: Secondary | ICD-10-CM | POA: Diagnosis not present

## 2022-08-03 DIAGNOSIS — S82841A Displaced bimalleolar fracture of right lower leg, initial encounter for closed fracture: Secondary | ICD-10-CM | POA: Diagnosis not present

## 2022-08-04 DIAGNOSIS — S82841A Displaced bimalleolar fracture of right lower leg, initial encounter for closed fracture: Secondary | ICD-10-CM | POA: Diagnosis not present

## 2022-08-09 DIAGNOSIS — N179 Acute kidney failure, unspecified: Secondary | ICD-10-CM | POA: Diagnosis not present

## 2022-08-09 DIAGNOSIS — Z8781 Personal history of (healed) traumatic fracture: Secondary | ICD-10-CM | POA: Diagnosis not present

## 2022-08-09 DIAGNOSIS — S82891A Other fracture of right lower leg, initial encounter for closed fracture: Secondary | ICD-10-CM | POA: Diagnosis not present

## 2022-08-09 DIAGNOSIS — Z4889 Encounter for other specified surgical aftercare: Secondary | ICD-10-CM | POA: Diagnosis not present

## 2022-08-09 DIAGNOSIS — S82841A Displaced bimalleolar fracture of right lower leg, initial encounter for closed fracture: Secondary | ICD-10-CM | POA: Diagnosis not present

## 2022-08-09 DIAGNOSIS — M6282 Rhabdomyolysis: Secondary | ICD-10-CM | POA: Diagnosis not present

## 2022-08-09 DIAGNOSIS — E785 Hyperlipidemia, unspecified: Secondary | ICD-10-CM | POA: Diagnosis not present

## 2022-08-09 DIAGNOSIS — S82844D Nondisplaced bimalleolar fracture of right lower leg, subsequent encounter for closed fracture with routine healing: Secondary | ICD-10-CM | POA: Diagnosis not present

## 2022-08-09 DIAGNOSIS — W19XXXA Unspecified fall, initial encounter: Secondary | ICD-10-CM | POA: Diagnosis not present

## 2022-08-09 DIAGNOSIS — Z9889 Other specified postprocedural states: Secondary | ICD-10-CM | POA: Diagnosis not present

## 2022-08-09 DIAGNOSIS — R69 Illness, unspecified: Secondary | ICD-10-CM | POA: Diagnosis not present

## 2022-08-09 DIAGNOSIS — M810 Age-related osteoporosis without current pathological fracture: Secondary | ICD-10-CM | POA: Diagnosis not present

## 2022-08-09 DIAGNOSIS — M6281 Muscle weakness (generalized): Secondary | ICD-10-CM | POA: Diagnosis not present

## 2022-08-09 DIAGNOSIS — I10 Essential (primary) hypertension: Secondary | ICD-10-CM | POA: Diagnosis not present

## 2022-08-09 DIAGNOSIS — T796XXA Traumatic ischemia of muscle, initial encounter: Secondary | ICD-10-CM | POA: Diagnosis not present

## 2022-08-09 DIAGNOSIS — M25571 Pain in right ankle and joints of right foot: Secondary | ICD-10-CM | POA: Diagnosis not present

## 2022-08-09 DIAGNOSIS — R7989 Other specified abnormal findings of blood chemistry: Secondary | ICD-10-CM | POA: Diagnosis not present

## 2022-08-09 DIAGNOSIS — S82891D Other fracture of right lower leg, subsequent encounter for closed fracture with routine healing: Secondary | ICD-10-CM | POA: Diagnosis not present

## 2022-08-10 DIAGNOSIS — M810 Age-related osteoporosis without current pathological fracture: Secondary | ICD-10-CM | POA: Diagnosis not present

## 2022-08-10 DIAGNOSIS — R69 Illness, unspecified: Secondary | ICD-10-CM | POA: Diagnosis not present

## 2022-08-10 DIAGNOSIS — S82891D Other fracture of right lower leg, subsequent encounter for closed fracture with routine healing: Secondary | ICD-10-CM | POA: Diagnosis not present

## 2022-08-10 DIAGNOSIS — M6282 Rhabdomyolysis: Secondary | ICD-10-CM | POA: Diagnosis not present

## 2022-08-10 DIAGNOSIS — E785 Hyperlipidemia, unspecified: Secondary | ICD-10-CM | POA: Diagnosis not present

## 2022-08-10 DIAGNOSIS — I10 Essential (primary) hypertension: Secondary | ICD-10-CM | POA: Diagnosis not present

## 2022-08-10 DIAGNOSIS — S82844D Nondisplaced bimalleolar fracture of right lower leg, subsequent encounter for closed fracture with routine healing: Secondary | ICD-10-CM | POA: Diagnosis not present

## 2022-08-10 DIAGNOSIS — Z9889 Other specified postprocedural states: Secondary | ICD-10-CM | POA: Diagnosis not present

## 2022-08-10 DIAGNOSIS — N179 Acute kidney failure, unspecified: Secondary | ICD-10-CM | POA: Diagnosis not present

## 2022-08-10 DIAGNOSIS — M6281 Muscle weakness (generalized): Secondary | ICD-10-CM | POA: Diagnosis not present

## 2022-08-10 DIAGNOSIS — Z8781 Personal history of (healed) traumatic fracture: Secondary | ICD-10-CM | POA: Diagnosis not present

## 2022-08-13 DIAGNOSIS — N179 Acute kidney failure, unspecified: Secondary | ICD-10-CM | POA: Diagnosis not present

## 2022-08-13 DIAGNOSIS — M6282 Rhabdomyolysis: Secondary | ICD-10-CM | POA: Diagnosis not present

## 2022-08-13 DIAGNOSIS — M6281 Muscle weakness (generalized): Secondary | ICD-10-CM | POA: Diagnosis not present

## 2022-08-13 DIAGNOSIS — S82891D Other fracture of right lower leg, subsequent encounter for closed fracture with routine healing: Secondary | ICD-10-CM | POA: Diagnosis not present

## 2022-08-13 DIAGNOSIS — I10 Essential (primary) hypertension: Secondary | ICD-10-CM | POA: Diagnosis not present

## 2022-08-13 DIAGNOSIS — Z8781 Personal history of (healed) traumatic fracture: Secondary | ICD-10-CM | POA: Diagnosis not present

## 2022-08-13 DIAGNOSIS — Z9889 Other specified postprocedural states: Secondary | ICD-10-CM | POA: Diagnosis not present

## 2022-08-13 DIAGNOSIS — S82844D Nondisplaced bimalleolar fracture of right lower leg, subsequent encounter for closed fracture with routine healing: Secondary | ICD-10-CM | POA: Diagnosis not present

## 2022-08-13 DIAGNOSIS — M810 Age-related osteoporosis without current pathological fracture: Secondary | ICD-10-CM | POA: Diagnosis not present

## 2022-08-13 DIAGNOSIS — R69 Illness, unspecified: Secondary | ICD-10-CM | POA: Diagnosis not present

## 2022-08-13 DIAGNOSIS — E785 Hyperlipidemia, unspecified: Secondary | ICD-10-CM | POA: Diagnosis not present

## 2022-08-14 DIAGNOSIS — Z8781 Personal history of (healed) traumatic fracture: Secondary | ICD-10-CM | POA: Diagnosis not present

## 2022-08-14 DIAGNOSIS — S82891A Other fracture of right lower leg, initial encounter for closed fracture: Secondary | ICD-10-CM | POA: Diagnosis not present

## 2022-08-14 DIAGNOSIS — M6282 Rhabdomyolysis: Secondary | ICD-10-CM | POA: Diagnosis not present

## 2022-08-14 DIAGNOSIS — R69 Illness, unspecified: Secondary | ICD-10-CM | POA: Diagnosis not present

## 2022-08-14 DIAGNOSIS — W19XXXA Unspecified fall, initial encounter: Secondary | ICD-10-CM | POA: Diagnosis not present

## 2022-08-14 DIAGNOSIS — Z9889 Other specified postprocedural states: Secondary | ICD-10-CM | POA: Diagnosis not present

## 2022-08-14 DIAGNOSIS — R7989 Other specified abnormal findings of blood chemistry: Secondary | ICD-10-CM | POA: Diagnosis not present

## 2022-08-14 DIAGNOSIS — E785 Hyperlipidemia, unspecified: Secondary | ICD-10-CM | POA: Diagnosis not present

## 2022-08-14 DIAGNOSIS — I10 Essential (primary) hypertension: Secondary | ICD-10-CM | POA: Diagnosis not present

## 2022-08-15 DIAGNOSIS — R69 Illness, unspecified: Secondary | ICD-10-CM | POA: Diagnosis not present

## 2022-08-15 DIAGNOSIS — M6282 Rhabdomyolysis: Secondary | ICD-10-CM | POA: Diagnosis not present

## 2022-08-15 DIAGNOSIS — Z9889 Other specified postprocedural states: Secondary | ICD-10-CM | POA: Diagnosis not present

## 2022-08-15 DIAGNOSIS — Z8781 Personal history of (healed) traumatic fracture: Secondary | ICD-10-CM | POA: Diagnosis not present

## 2022-08-15 DIAGNOSIS — E785 Hyperlipidemia, unspecified: Secondary | ICD-10-CM | POA: Diagnosis not present

## 2022-08-15 DIAGNOSIS — W19XXXA Unspecified fall, initial encounter: Secondary | ICD-10-CM | POA: Diagnosis not present

## 2022-08-15 DIAGNOSIS — I10 Essential (primary) hypertension: Secondary | ICD-10-CM | POA: Diagnosis not present

## 2022-08-15 DIAGNOSIS — S82891A Other fracture of right lower leg, initial encounter for closed fracture: Secondary | ICD-10-CM | POA: Diagnosis not present

## 2022-08-15 DIAGNOSIS — M810 Age-related osteoporosis without current pathological fracture: Secondary | ICD-10-CM | POA: Diagnosis not present

## 2022-08-16 DIAGNOSIS — Z4889 Encounter for other specified surgical aftercare: Secondary | ICD-10-CM | POA: Diagnosis not present

## 2022-08-16 DIAGNOSIS — M25571 Pain in right ankle and joints of right foot: Secondary | ICD-10-CM | POA: Diagnosis not present

## 2022-08-28 DIAGNOSIS — I10 Essential (primary) hypertension: Secondary | ICD-10-CM | POA: Diagnosis not present

## 2022-08-28 DIAGNOSIS — R69 Illness, unspecified: Secondary | ICD-10-CM | POA: Diagnosis not present

## 2022-08-28 DIAGNOSIS — S82891A Other fracture of right lower leg, initial encounter for closed fracture: Secondary | ICD-10-CM | POA: Diagnosis not present

## 2022-08-30 DIAGNOSIS — M810 Age-related osteoporosis without current pathological fracture: Secondary | ICD-10-CM | POA: Diagnosis not present

## 2022-08-30 DIAGNOSIS — W19XXXA Unspecified fall, initial encounter: Secondary | ICD-10-CM | POA: Diagnosis not present

## 2022-08-30 DIAGNOSIS — S82891A Other fracture of right lower leg, initial encounter for closed fracture: Secondary | ICD-10-CM | POA: Diagnosis not present

## 2022-08-30 DIAGNOSIS — E785 Hyperlipidemia, unspecified: Secondary | ICD-10-CM | POA: Diagnosis not present

## 2022-08-30 DIAGNOSIS — Z8781 Personal history of (healed) traumatic fracture: Secondary | ICD-10-CM | POA: Diagnosis not present

## 2022-08-30 DIAGNOSIS — Z9889 Other specified postprocedural states: Secondary | ICD-10-CM | POA: Diagnosis not present

## 2022-08-30 DIAGNOSIS — R69 Illness, unspecified: Secondary | ICD-10-CM | POA: Diagnosis not present

## 2022-08-30 DIAGNOSIS — I10 Essential (primary) hypertension: Secondary | ICD-10-CM | POA: Diagnosis not present

## 2022-09-05 DIAGNOSIS — E785 Hyperlipidemia, unspecified: Secondary | ICD-10-CM | POA: Diagnosis not present

## 2022-09-05 DIAGNOSIS — I10 Essential (primary) hypertension: Secondary | ICD-10-CM | POA: Diagnosis not present

## 2022-09-05 DIAGNOSIS — Z9151 Personal history of suicidal behavior: Secondary | ICD-10-CM | POA: Diagnosis not present

## 2022-09-05 DIAGNOSIS — S82891A Other fracture of right lower leg, initial encounter for closed fracture: Secondary | ICD-10-CM | POA: Diagnosis not present

## 2022-09-05 DIAGNOSIS — R69 Illness, unspecified: Secondary | ICD-10-CM | POA: Diagnosis not present

## 2022-09-05 DIAGNOSIS — Z8781 Personal history of (healed) traumatic fracture: Secondary | ICD-10-CM | POA: Diagnosis not present

## 2022-09-05 DIAGNOSIS — F411 Generalized anxiety disorder: Secondary | ICD-10-CM | POA: Diagnosis not present

## 2022-09-05 DIAGNOSIS — S82891D Other fracture of right lower leg, subsequent encounter for closed fracture with routine healing: Secondary | ICD-10-CM | POA: Diagnosis not present

## 2022-09-05 DIAGNOSIS — M810 Age-related osteoporosis without current pathological fracture: Secondary | ICD-10-CM | POA: Diagnosis not present

## 2022-09-05 DIAGNOSIS — F331 Major depressive disorder, recurrent, moderate: Secondary | ICD-10-CM | POA: Diagnosis not present

## 2022-09-05 DIAGNOSIS — R531 Weakness: Secondary | ICD-10-CM | POA: Diagnosis not present

## 2022-09-06 DIAGNOSIS — S82891D Other fracture of right lower leg, subsequent encounter for closed fracture with routine healing: Secondary | ICD-10-CM | POA: Diagnosis not present

## 2022-09-06 DIAGNOSIS — Z556 Problems related to health literacy: Secondary | ICD-10-CM | POA: Diagnosis not present

## 2022-09-06 DIAGNOSIS — R69 Illness, unspecified: Secondary | ICD-10-CM | POA: Diagnosis not present

## 2022-09-06 DIAGNOSIS — I10 Essential (primary) hypertension: Secondary | ICD-10-CM | POA: Diagnosis not present

## 2022-09-06 DIAGNOSIS — M800AXD Age-related osteoporosis with current pathological fracture, other site, subsequent encounter for fracture with routine healing: Secondary | ICD-10-CM | POA: Diagnosis not present

## 2022-09-06 DIAGNOSIS — Z9181 History of falling: Secondary | ICD-10-CM | POA: Diagnosis not present

## 2022-09-06 DIAGNOSIS — M6282 Rhabdomyolysis: Secondary | ICD-10-CM | POA: Diagnosis not present

## 2022-09-11 DIAGNOSIS — R69 Illness, unspecified: Secondary | ICD-10-CM | POA: Diagnosis not present

## 2022-09-11 DIAGNOSIS — Z9181 History of falling: Secondary | ICD-10-CM | POA: Diagnosis not present

## 2022-09-11 DIAGNOSIS — Z556 Problems related to health literacy: Secondary | ICD-10-CM | POA: Diagnosis not present

## 2022-09-11 DIAGNOSIS — M800AXD Age-related osteoporosis with current pathological fracture, other site, subsequent encounter for fracture with routine healing: Secondary | ICD-10-CM | POA: Diagnosis not present

## 2022-09-11 DIAGNOSIS — M6282 Rhabdomyolysis: Secondary | ICD-10-CM | POA: Diagnosis not present

## 2022-09-11 DIAGNOSIS — I10 Essential (primary) hypertension: Secondary | ICD-10-CM | POA: Diagnosis not present

## 2022-09-13 DIAGNOSIS — M25571 Pain in right ankle and joints of right foot: Secondary | ICD-10-CM | POA: Diagnosis not present

## 2022-09-14 DIAGNOSIS — R69 Illness, unspecified: Secondary | ICD-10-CM | POA: Diagnosis not present

## 2022-09-14 DIAGNOSIS — F411 Generalized anxiety disorder: Secondary | ICD-10-CM | POA: Diagnosis not present

## 2022-09-17 DIAGNOSIS — Z9181 History of falling: Secondary | ICD-10-CM | POA: Diagnosis not present

## 2022-09-17 DIAGNOSIS — M800AXD Age-related osteoporosis with current pathological fracture, other site, subsequent encounter for fracture with routine healing: Secondary | ICD-10-CM | POA: Diagnosis not present

## 2022-09-17 DIAGNOSIS — M6282 Rhabdomyolysis: Secondary | ICD-10-CM | POA: Diagnosis not present

## 2022-09-17 DIAGNOSIS — I10 Essential (primary) hypertension: Secondary | ICD-10-CM | POA: Diagnosis not present

## 2022-09-17 DIAGNOSIS — R69 Illness, unspecified: Secondary | ICD-10-CM | POA: Diagnosis not present

## 2022-09-17 DIAGNOSIS — Z556 Problems related to health literacy: Secondary | ICD-10-CM | POA: Diagnosis not present

## 2022-09-20 DIAGNOSIS — M800AXD Age-related osteoporosis with current pathological fracture, other site, subsequent encounter for fracture with routine healing: Secondary | ICD-10-CM | POA: Diagnosis not present

## 2022-09-20 DIAGNOSIS — Z556 Problems related to health literacy: Secondary | ICD-10-CM | POA: Diagnosis not present

## 2022-09-20 DIAGNOSIS — M6282 Rhabdomyolysis: Secondary | ICD-10-CM | POA: Diagnosis not present

## 2022-09-20 DIAGNOSIS — R69 Illness, unspecified: Secondary | ICD-10-CM | POA: Diagnosis not present

## 2022-09-20 DIAGNOSIS — Z9181 History of falling: Secondary | ICD-10-CM | POA: Diagnosis not present

## 2022-09-20 DIAGNOSIS — I10 Essential (primary) hypertension: Secondary | ICD-10-CM | POA: Diagnosis not present

## 2022-09-24 DIAGNOSIS — R69 Illness, unspecified: Secondary | ICD-10-CM | POA: Diagnosis not present

## 2022-09-24 DIAGNOSIS — M800AXD Age-related osteoporosis with current pathological fracture, other site, subsequent encounter for fracture with routine healing: Secondary | ICD-10-CM | POA: Diagnosis not present

## 2022-09-24 DIAGNOSIS — M6282 Rhabdomyolysis: Secondary | ICD-10-CM | POA: Diagnosis not present

## 2022-09-24 DIAGNOSIS — Z9181 History of falling: Secondary | ICD-10-CM | POA: Diagnosis not present

## 2022-09-24 DIAGNOSIS — I10 Essential (primary) hypertension: Secondary | ICD-10-CM | POA: Diagnosis not present

## 2022-09-24 DIAGNOSIS — Z556 Problems related to health literacy: Secondary | ICD-10-CM | POA: Diagnosis not present

## 2022-10-01 DIAGNOSIS — Z9181 History of falling: Secondary | ICD-10-CM | POA: Diagnosis not present

## 2022-10-01 DIAGNOSIS — M6282 Rhabdomyolysis: Secondary | ICD-10-CM | POA: Diagnosis not present

## 2022-10-01 DIAGNOSIS — I10 Essential (primary) hypertension: Secondary | ICD-10-CM | POA: Diagnosis not present

## 2022-10-01 DIAGNOSIS — Z556 Problems related to health literacy: Secondary | ICD-10-CM | POA: Diagnosis not present

## 2022-10-01 DIAGNOSIS — R69 Illness, unspecified: Secondary | ICD-10-CM | POA: Diagnosis not present

## 2022-10-01 DIAGNOSIS — M800AXD Age-related osteoporosis with current pathological fracture, other site, subsequent encounter for fracture with routine healing: Secondary | ICD-10-CM | POA: Diagnosis not present

## 2022-10-04 IMAGING — CT CT HEAD W/O CM
4 series · 16 of 47 positions shown, 18 images · non-contrast
Comparison: None.

CLINICAL DATA: Memory loss

EXAM:
CT HEAD WITHOUT CONTRAST
TECHNIQUE: Contiguous axial images were obtained from the base of the skull
through the vertex without intravenous contrast.

[Series 2: head 5.00 hr40 s3 axial ibhc · axial · 0.43mm/px · z∈[-615,-500]mm · 6 of 33 slices shown, 8 images]
[im 5/33  brain]
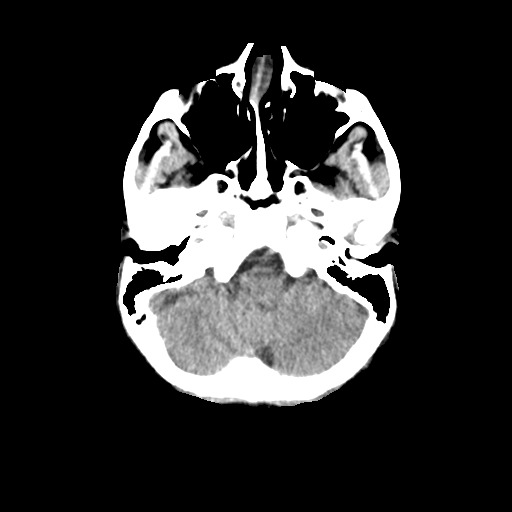
[im 5/33  bone]
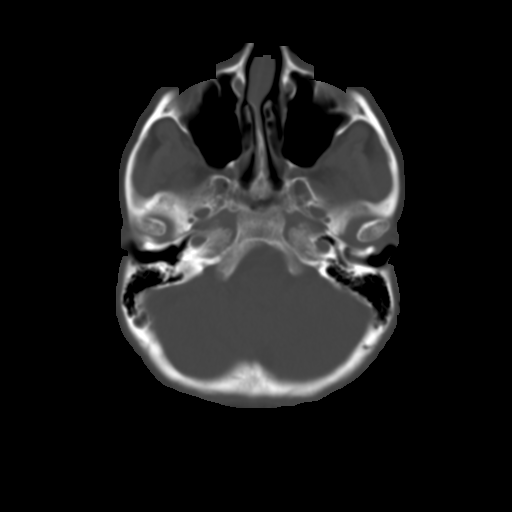
[im 10/33  brain]
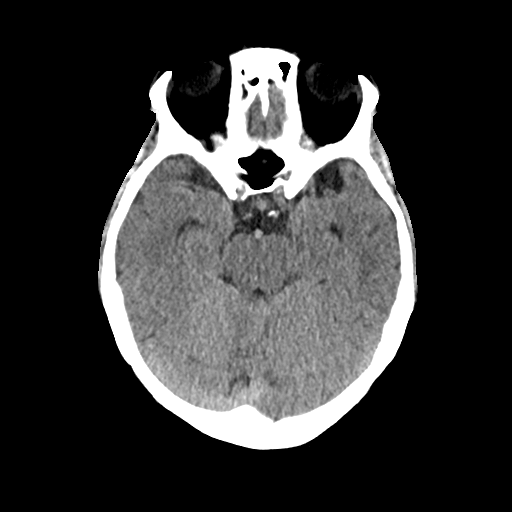
[im 14/33  brain]
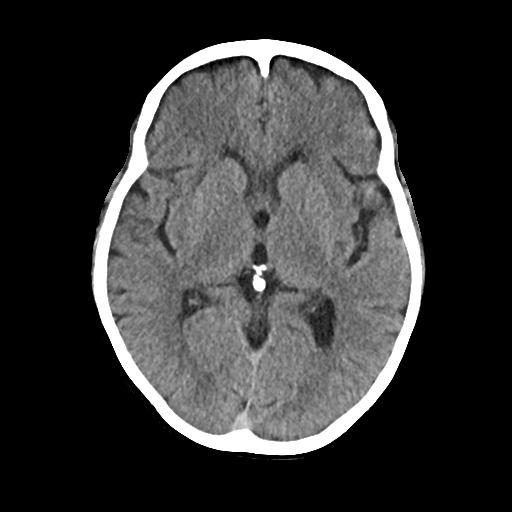
[im 19/33  brain]
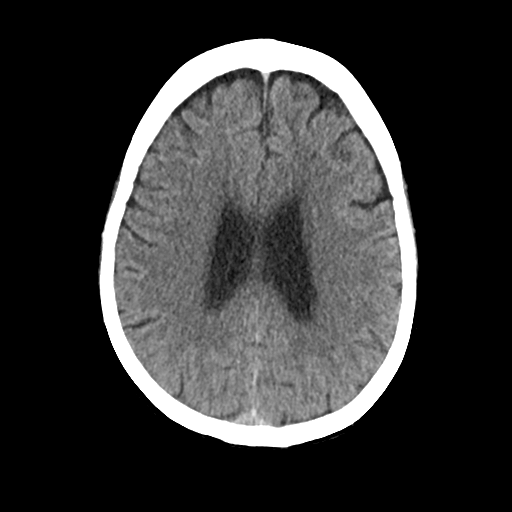
[im 23/33  brain]
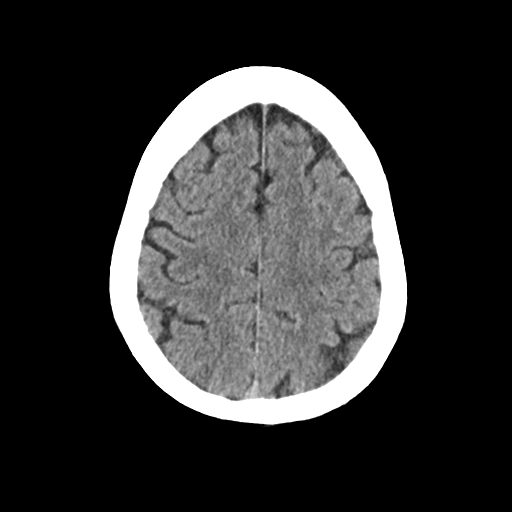
[im 23/33  bone]
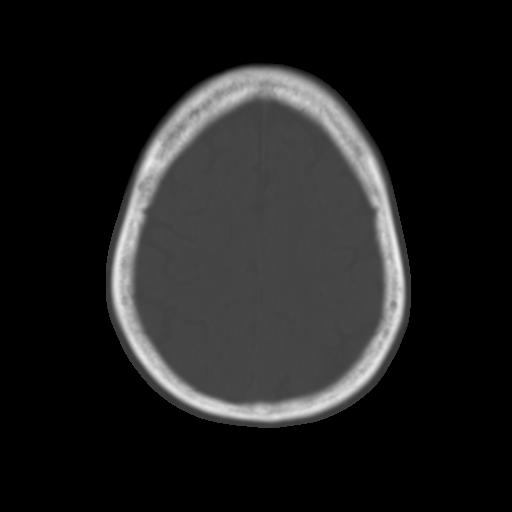
[im 28/33  brain]
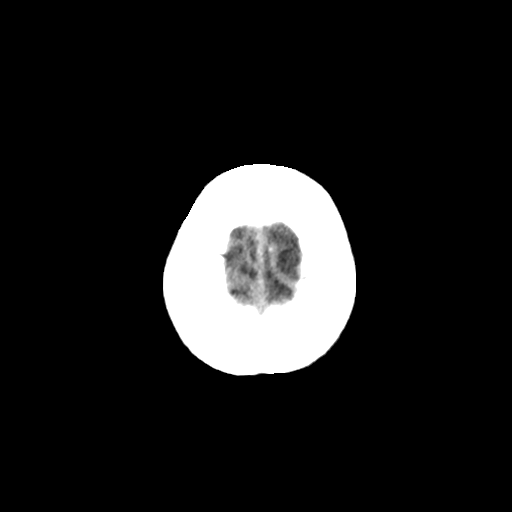

[Series 3: head 2.00 hr60 s3 axial bone · axial · 0.43mm/px · z∈[-622,-566]mm · 4 of 84 slices shown]
[im 8/84  bone]
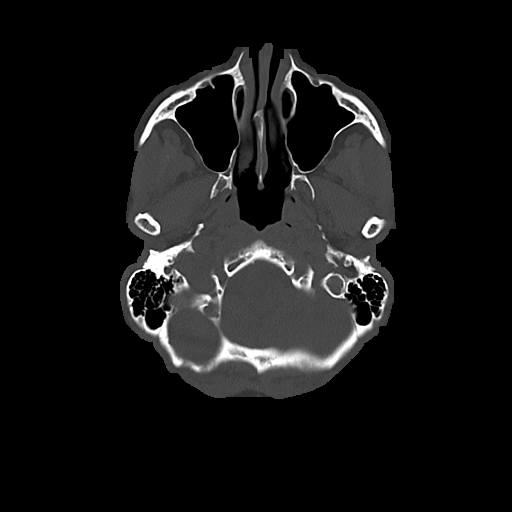
[im 16/84  bone]
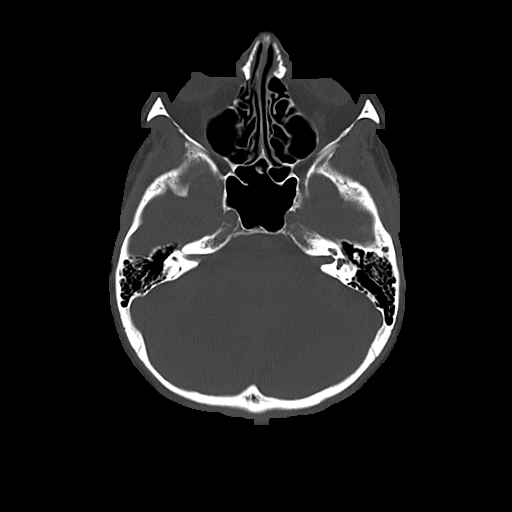
[im 28/84  bone]
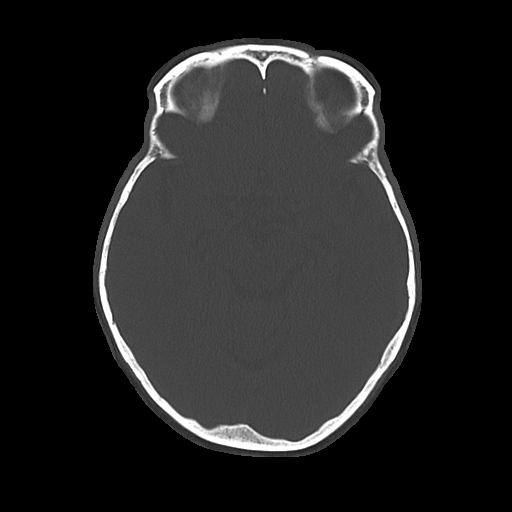
[im 36/84  bone]
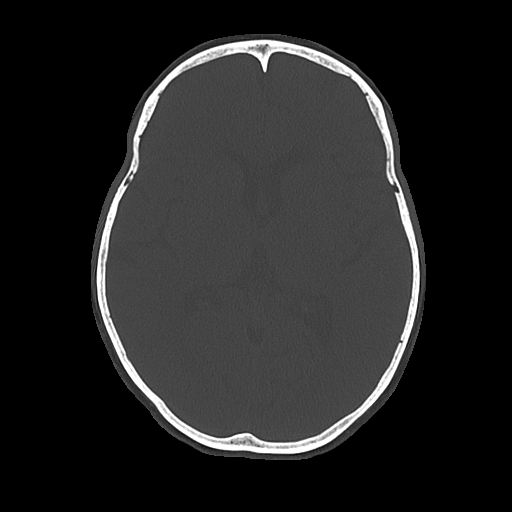

[Series 4: head 3.00 hr40 s3 sag · sagittal · 0.32mm/px · 3 of 73 slices shown]
[im 25/73  brain]
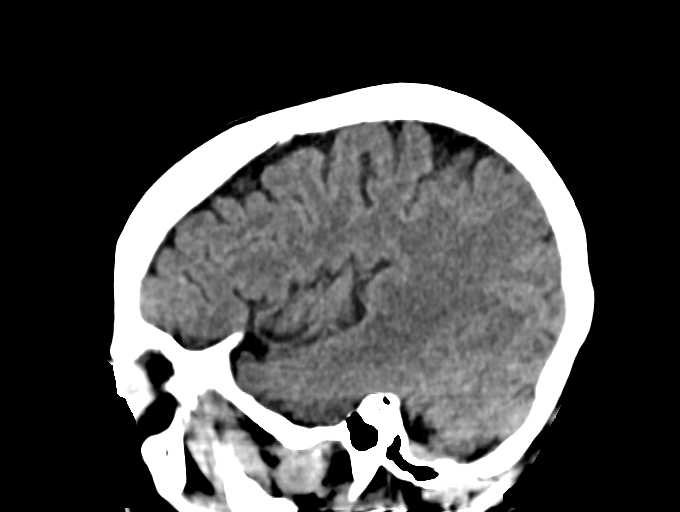
[im 37/73  brain]
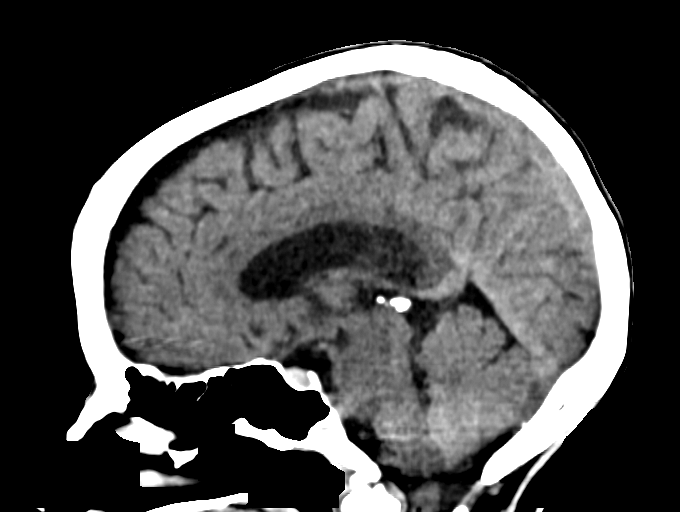
[im 49/73  brain]
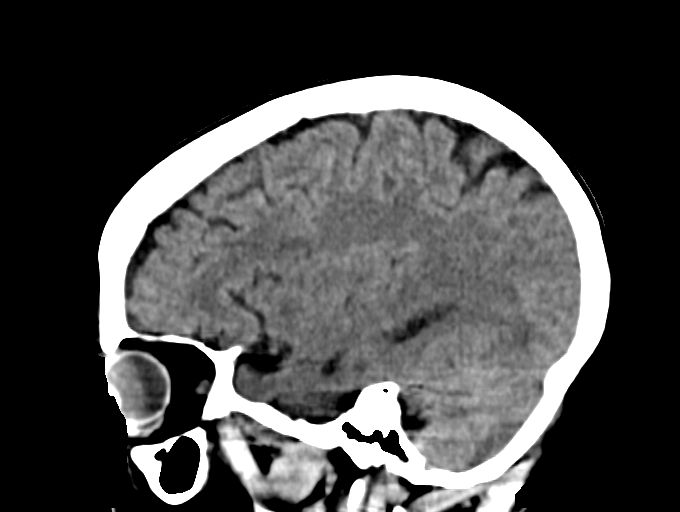

[Series 6: head 3.00 hr40 s3 cor · coronal · 0.32mm/px · 3 of 73 slices shown]
[im 25/73  brain]
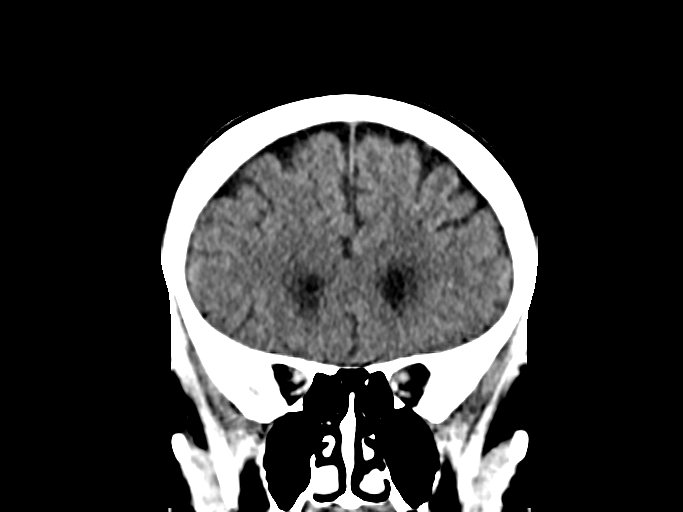
[im 33/73  brain]
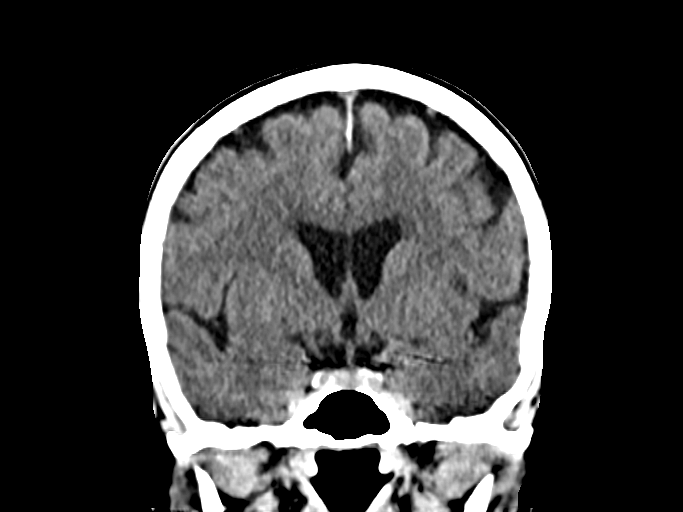
[im 41/73  brain]
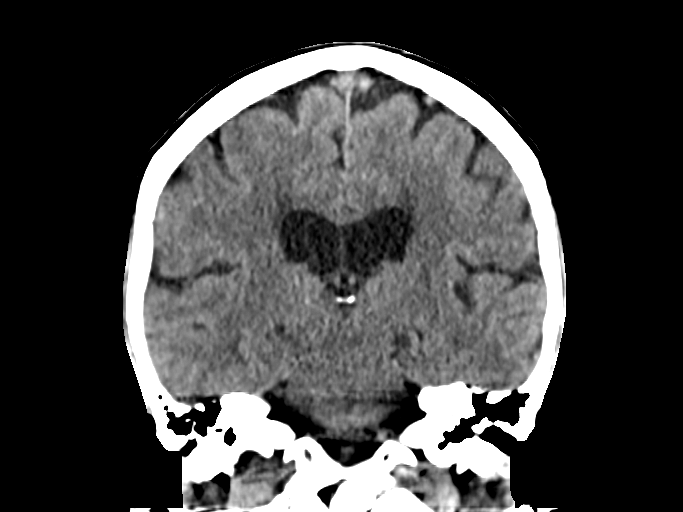

[16 of 47 positions shown; findings below may reference images not displayed]

FINDINGS: Brain: No acute territorial infarction, hemorrhage or intracranial
mass. Mild atrophy. Minimal periventricular white matter hypodensity
likely chronic small vessel ischemic change. Normal ventricle size

Vascular: No hyperdense vessels. Scattered carotid vascular
calcification

Skull: Normal. Negative for fracture or focal lesion.

Sinuses/Orbits: No acute finding.

Other: None
IMPRESSION: 1. No CT evidence for acute intracranial.
2. Minimal atrophy and chronic small vessel ischemic change of white
matter

## 2022-10-05 DIAGNOSIS — I10 Essential (primary) hypertension: Secondary | ICD-10-CM | POA: Diagnosis not present

## 2022-10-05 DIAGNOSIS — E785 Hyperlipidemia, unspecified: Secondary | ICD-10-CM | POA: Diagnosis not present

## 2022-10-06 DIAGNOSIS — Z8781 Personal history of (healed) traumatic fracture: Secondary | ICD-10-CM | POA: Diagnosis not present

## 2022-10-06 DIAGNOSIS — S82891D Other fracture of right lower leg, subsequent encounter for closed fracture with routine healing: Secondary | ICD-10-CM | POA: Diagnosis not present

## 2022-10-11 DIAGNOSIS — M25571 Pain in right ankle and joints of right foot: Secondary | ICD-10-CM | POA: Diagnosis not present

## 2022-10-12 DIAGNOSIS — R531 Weakness: Secondary | ICD-10-CM | POA: Diagnosis not present

## 2022-10-12 DIAGNOSIS — I10 Essential (primary) hypertension: Secondary | ICD-10-CM | POA: Diagnosis not present

## 2022-10-12 DIAGNOSIS — M810 Age-related osteoporosis without current pathological fracture: Secondary | ICD-10-CM | POA: Diagnosis not present

## 2022-10-12 DIAGNOSIS — R69 Illness, unspecified: Secondary | ICD-10-CM | POA: Diagnosis not present

## 2022-10-12 DIAGNOSIS — E2839 Other primary ovarian failure: Secondary | ICD-10-CM | POA: Diagnosis not present

## 2022-10-12 DIAGNOSIS — Z1389 Encounter for screening for other disorder: Secondary | ICD-10-CM | POA: Diagnosis not present

## 2022-10-12 DIAGNOSIS — S82891A Other fracture of right lower leg, initial encounter for closed fracture: Secondary | ICD-10-CM | POA: Diagnosis not present

## 2022-10-12 DIAGNOSIS — Z23 Encounter for immunization: Secondary | ICD-10-CM | POA: Diagnosis not present

## 2022-10-12 DIAGNOSIS — Z6823 Body mass index (BMI) 23.0-23.9, adult: Secondary | ICD-10-CM | POA: Diagnosis not present

## 2022-10-12 DIAGNOSIS — E785 Hyperlipidemia, unspecified: Secondary | ICD-10-CM | POA: Diagnosis not present

## 2022-10-29 DIAGNOSIS — M810 Age-related osteoporosis without current pathological fracture: Secondary | ICD-10-CM | POA: Diagnosis not present

## 2022-11-14 DIAGNOSIS — F339 Major depressive disorder, recurrent, unspecified: Secondary | ICD-10-CM | POA: Diagnosis not present

## 2022-11-14 DIAGNOSIS — R69 Illness, unspecified: Secondary | ICD-10-CM | POA: Diagnosis not present

## 2022-12-07 DIAGNOSIS — M800AXD Age-related osteoporosis with current pathological fracture, other site, subsequent encounter for fracture with routine healing: Secondary | ICD-10-CM | POA: Diagnosis not present

## 2022-12-07 DIAGNOSIS — I10 Essential (primary) hypertension: Secondary | ICD-10-CM | POA: Diagnosis not present

## 2022-12-07 DIAGNOSIS — Z9181 History of falling: Secondary | ICD-10-CM | POA: Diagnosis not present

## 2022-12-07 DIAGNOSIS — Z556 Problems related to health literacy: Secondary | ICD-10-CM | POA: Diagnosis not present

## 2022-12-07 DIAGNOSIS — R69 Illness, unspecified: Secondary | ICD-10-CM | POA: Diagnosis not present

## 2022-12-07 DIAGNOSIS — M6282 Rhabdomyolysis: Secondary | ICD-10-CM | POA: Diagnosis not present

## 2023-01-07 ENCOUNTER — Encounter (HOSPITAL_COMMUNITY): Payer: Self-pay
